# Patient Record
Sex: Female | Born: 1965 | Race: White | Hispanic: No | Marital: Married | Smoking: Never smoker
Health system: Southern US, Community
[De-identification: ages and names within clinical notes are randomized; demographics above are authoritative.]

## PROBLEM LIST (undated history)

## (undated) DIAGNOSIS — J302 Other seasonal allergic rhinitis: Secondary | ICD-10-CM

## (undated) DIAGNOSIS — K449 Diaphragmatic hernia without obstruction or gangrene: Secondary | ICD-10-CM

## (undated) DIAGNOSIS — K219 Gastro-esophageal reflux disease without esophagitis: Secondary | ICD-10-CM

## (undated) DIAGNOSIS — F419 Anxiety disorder, unspecified: Secondary | ICD-10-CM

## (undated) HISTORY — PX: CHOLECYSTECTOMY: SHX55

## (undated) HISTORY — PX: NASAL SEPTUM SURGERY: SHX37

## (undated) HISTORY — PX: LAPAROSCOPY: SHX197

## (undated) HISTORY — DX: Diaphragmatic hernia without obstruction or gangrene: K44.9

---

## 2007-03-21 ENCOUNTER — Ambulatory Visit: Payer: Self-pay | Admitting: Cardiology

## 2014-02-28 ENCOUNTER — Other Ambulatory Visit: Payer: Self-pay | Admitting: Family Medicine

## 2014-02-28 DIAGNOSIS — M25512 Pain in left shoulder: Secondary | ICD-10-CM

## 2014-03-03 ENCOUNTER — Other Ambulatory Visit: Payer: Self-pay

## 2014-03-06 ENCOUNTER — Ambulatory Visit
Admission: RE | Admit: 2014-03-06 | Discharge: 2014-03-06 | Disposition: A | Discharge status: Home / Self Care | Payer: 59 | Source: Ambulatory Visit | Attending: Family Medicine | Admitting: Family Medicine

## 2014-03-06 DIAGNOSIS — M25512 Pain in left shoulder: Secondary | ICD-10-CM

## 2015-07-05 ENCOUNTER — Encounter (INDEPENDENT_AMBULATORY_CARE_PROVIDER_SITE_OTHER): Payer: Self-pay | Admitting: *Deleted

## 2015-09-13 ENCOUNTER — Encounter (INDEPENDENT_AMBULATORY_CARE_PROVIDER_SITE_OTHER): Payer: Self-pay | Admitting: *Deleted

## 2015-11-26 ENCOUNTER — Other Ambulatory Visit (INDEPENDENT_AMBULATORY_CARE_PROVIDER_SITE_OTHER): Payer: Self-pay | Admitting: *Deleted

## 2015-11-26 DIAGNOSIS — Z1211 Encounter for screening for malignant neoplasm of colon: Secondary | ICD-10-CM

## 2015-12-19 ENCOUNTER — Encounter (INDEPENDENT_AMBULATORY_CARE_PROVIDER_SITE_OTHER): Payer: Self-pay | Admitting: *Deleted

## 2015-12-19 ENCOUNTER — Other Ambulatory Visit (INDEPENDENT_AMBULATORY_CARE_PROVIDER_SITE_OTHER): Payer: Self-pay | Admitting: *Deleted

## 2015-12-19 MED ORDER — PEG 3350-KCL-NA BICARB-NACL 420 G PO SOLR: 4000.0000 mL | Freq: Once | ORAL | Status: DC

## 2015-12-19 NOTE — Telephone Encounter (Signed)
Patient needs trilyte 

## 2015-12-27 ENCOUNTER — Telehealth (INDEPENDENT_AMBULATORY_CARE_PROVIDER_SITE_OTHER): Payer: Self-pay | Admitting: *Deleted

## 2015-12-27 NOTE — Telephone Encounter (Signed)
Referring MD/PCP: daniel   Procedure: tcs  Reason/Indication:  screening  Has patient had this procedure before?  no  If so, when, by whom and where?    Is there a family history of colon cancer?  no  Who?  What age when diagnosed?    Is patient diabetic?   no      Does patient have prosthetic heart valve or mechanical valve?  no  Do you have a pacemaker?  no  Has patient ever had endocarditis? no  Has patient had joint replacement within last 12 months?  no  Does patient tend to be constipated or take laxatives? yes  Does patient have a history of alcohol/drug use?  no  Is patient on Coumadin, Plavix and/or Aspirin? no  Medications: lexapro 10 mg 1/2 tab daily, nexium 20 mg bid, prevacid daily, claritin 10 mg daily, miralax prn, vit bm multi vit, calcium, tylenol prn  Allergies: nkda  Medication Adjustment:   Procedure date & time: 01/23/16 at 830

## 2015-12-31 NOTE — Telephone Encounter (Signed)
agree

## 2016-01-23 ENCOUNTER — Ambulatory Visit (HOSPITAL_COMMUNITY)
Admission: RE | Admit: 2016-01-23 | Discharge: 2016-01-23 | Disposition: A | Discharge status: Home / Self Care | Payer: 59 | Source: Ambulatory Visit | Attending: Internal Medicine | Admitting: Internal Medicine

## 2016-01-23 ENCOUNTER — Encounter (HOSPITAL_COMMUNITY)
Admission: RE | Disposition: A | Discharge status: Home / Self Care | Payer: Self-pay | Source: Ambulatory Visit | Attending: Internal Medicine

## 2016-01-23 ENCOUNTER — Encounter (HOSPITAL_COMMUNITY): Payer: Self-pay | Admitting: *Deleted

## 2016-01-23 DIAGNOSIS — D120 Benign neoplasm of cecum: Secondary | ICD-10-CM | POA: Diagnosis not present

## 2016-01-23 DIAGNOSIS — Z79899 Other long term (current) drug therapy: Secondary | ICD-10-CM | POA: Insufficient documentation

## 2016-01-23 DIAGNOSIS — Z1211 Encounter for screening for malignant neoplasm of colon: Secondary | ICD-10-CM | POA: Diagnosis not present

## 2016-01-23 DIAGNOSIS — F419 Anxiety disorder, unspecified: Secondary | ICD-10-CM | POA: Diagnosis not present

## 2016-01-23 DIAGNOSIS — K644 Residual hemorrhoidal skin tags: Secondary | ICD-10-CM | POA: Insufficient documentation

## 2016-01-23 DIAGNOSIS — K219 Gastro-esophageal reflux disease without esophagitis: Secondary | ICD-10-CM | POA: Diagnosis not present

## 2016-01-23 HISTORY — DX: Other seasonal allergic rhinitis: J30.2

## 2016-01-23 HISTORY — DX: Gastro-esophageal reflux disease without esophagitis: K21.9

## 2016-01-23 HISTORY — DX: Anxiety disorder, unspecified: F41.9

## 2016-01-23 HISTORY — PX: COLONOSCOPY: SHX5424

## 2016-01-23 SURGERY — COLONOSCOPY
Anesthesia: Moderate Sedation

## 2016-01-23 MED ORDER — MEPERIDINE HCL 50 MG/ML IJ SOLN
INTRAMUSCULAR | Status: AC
  Filled 2016-01-23: qty 1

## 2016-01-23 MED ORDER — STERILE WATER FOR IRRIGATION IR SOLN
Status: DC | PRN
  Administered 2016-01-23: 08:00:00

## 2016-01-23 MED ORDER — SODIUM CHLORIDE 0.9 % IV SOLN
INTRAVENOUS | Status: DC
  Administered 2016-01-23: 08:00:00 via INTRAVENOUS

## 2016-01-23 MED ORDER — MEPERIDINE HCL 50 MG/ML IJ SOLN
INTRAMUSCULAR | Status: DC | PRN
  Administered 2016-01-23 (×2): 25 mg via INTRAVENOUS

## 2016-01-23 MED ORDER — MIDAZOLAM HCL 5 MG/5ML IJ SOLN
INTRAMUSCULAR | Status: DC | PRN
  Administered 2016-01-23 (×3): 2 mg via INTRAVENOUS

## 2016-01-23 MED ORDER — MIDAZOLAM HCL 5 MG/5ML IJ SOLN
INTRAMUSCULAR | Status: DC
Start: 2016-01-23 — End: 2016-01-23
  Filled 2016-01-23: qty 10

## 2016-01-23 NOTE — H&P (Signed)
Toni Wolf is an 50 y.o. female.   Chief Complaint: Patient is here for colonoscopy. HPI: Patient is 50 year old Caucasian female who is here for screening colonoscopy. She denies abdominal pain change in bowel habits or rectal bleeding. Family history is negative for CRC.  Past Medical History  Diagnosis Date  . Seasonal allergies   . Anxiety   . GERD (gastroesophageal reflux disease)     Past Surgical History  Procedure Laterality Date  . Cholecystectomy    . Nasal septum surgery    . Laparoscopy      History reviewed. No pertinent family history. Social History:  reports that she has never smoked. She does not have any smokeless tobacco history on file. She reports that she drinks alcohol. She reports that she does not use illicit drugs.  Allergies: No Known Allergies  Medications Prior to Admission  Medication Sig Dispense Refill  . calcium citrate-vitamin D (CITRACAL+D) 315-200 MG-UNIT tablet Take 1 tablet by mouth daily.    . Cyanocobalamin (VITAMIN B-12 PO) Take 1 tablet by mouth daily.    Marland Kitchen escitalopram (LEXAPRO) 10 MG tablet Take 5 mg by mouth daily.  0  . esomeprazole (NEXIUM) 40 MG capsule Take 40 mg by mouth daily at 12 noon.    Marland Kitchen FLORA-Q (FLORA-Q) CAPS capsule Take 1 capsule by mouth daily.    Marland Kitchen loratadine (CLARITIN) 10 MG tablet Take 10 mg by mouth daily.    . polyethylene glycol-electrolytes (NULYTELY/GOLYTELY) 420 g solution Take 4,000 mLs by mouth once. 4000 mL 0  . ranitidine (ZANTAC) 150 MG tablet Take 150 mg by mouth 2 (two) times daily.      No results found for this or any previous visit (from the past 48 hour(s)). No results found.  ROS  Blood pressure 112/71, pulse 65, temperature 98.2 F (36.8 C), temperature source Oral, resp. rate 13, height 5\' 4"  (1.626 m), weight 161 lb (73.029 kg), SpO2 100 %. Physical Exam  Constitutional: She appears well-developed and well-nourished.  HENT:  Mouth/Throat: Oropharynx is clear and moist.  Eyes:  Conjunctivae are normal. No scleral icterus.  Neck: No thyromegaly present.  Cardiovascular: Normal rate, regular rhythm and normal heart sounds.   No murmur heard. Respiratory: Effort normal and breath sounds normal.  GI: Soft. She exhibits no distension and no mass. There is no tenderness.  Musculoskeletal: She exhibits no edema.  Lymphadenopathy:    She has no cervical adenopathy.  Neurological: She is alert.  Skin: Skin is warm and dry.     Assessment/Plan Average risk screening colonoscopy.  Rogene Houston, MD 01/23/2016, 8:20 AM

## 2016-01-23 NOTE — Discharge Instructions (Signed)
No aspirin or NSAIDs for 3 days. Resume usual medications and diet. No driving for 24 hours. Physician will call with biopsy results.   Colon Polyps Polyps are lumps of extra tissue growing inside the body. Polyps can grow in the large intestine (colon). Most colon polyps are noncancerous (benign). However, some colon polyps can become cancerous over time. Polyps that are larger than a pea may be harmful. To be safe, caregivers remove and test all polyps. CAUSES  Polyps form when mutations in the genes cause your cells to grow and divide even though no more tissue is needed. RISK FACTORS There are a number of risk factors that can increase your chances of getting colon polyps. They include:  Being older than 50 years.  Family history of colon polyps or colon cancer.  Long-term colon diseases, such as colitis or Crohn disease.  Being overweight.  Smoking.  Being inactive.  Drinking too much alcohol. SYMPTOMS  Most small polyps do not cause symptoms. If symptoms are present, they may include:  Blood in the stool. The stool may look dark red or black.  Constipation or diarrhea that lasts longer than 1 week. DIAGNOSIS People often do not know they have polyps until their caregiver finds them during a regular checkup. Your caregiver can use 4 tests to check for polyps:  Digital rectal exam. The caregiver wears gloves and feels inside the rectum. This test would find polyps only in the rectum.  Barium enema. The caregiver puts a liquid called barium into your rectum before taking X-rays of your colon. Barium makes your colon look white. Polyps are dark, so they are easy to see in the X-ray pictures.  Sigmoidoscopy. A thin, flexible tube (sigmoidoscope) is placed into your rectum. The sigmoidoscope has a light and tiny camera in it. The caregiver uses the sigmoidoscope to look at the last third of your colon.  Colonoscopy. This test is like sigmoidoscopy, but the caregiver looks at  the entire colon. This is the most common method for finding and removing polyps. TREATMENT  Any polyps will be removed during a sigmoidoscopy or colonoscopy. The polyps are then tested for cancer. PREVENTION  To help lower your risk of getting more colon polyps:  Eat plenty of fruits and vegetables. Avoid eating fatty foods.  Do not smoke.  Avoid drinking alcohol.  Exercise every day.  Lose weight if recommended by your caregiver.  Eat plenty of calcium and folate. Foods that are rich in calcium include milk, cheese, and broccoli. Foods that are rich in folate include chickpeas, kidney beans, and spinach. HOME CARE INSTRUCTIONS Keep all follow-up appointments as directed by your caregiver. You may need periodic exams to check for polyps. SEEK MEDICAL CARE IF: You notice bleeding during a bowel movement.   This information is not intended to replace advice given to you by your health care provider. Make sure you discuss any questions you have with your health care provider.   Document Released: 07/22/2004 Document Revised: 11/16/2014 Document Reviewed: 01/05/2012 Elsevier Interactive Patient Education 2016 Elsevier [employer] Colonoscopy, Care After Refer to this sheet in the next few weeks. These instructions provide you with information on caring for yourself after your procedure. Your health care provider may also give you more specific instructions. Your treatment has been planned according to current medical practices, but problems sometimes occur. Call your health care provider if you have any problems or questions after your procedure. WHAT TO EXPECT AFTER THE PROCEDURE  After your procedure, it is  typical to have the following:  A small amount of blood in your stool.  Moderate amounts of gas and mild abdominal cramping or bloating. HOME CARE INSTRUCTIONS  Do not drive, operate machinery, or sign important documents for 24 hours.  You may shower and resume your regular  physical activities, but move at a slower pace for the first 24 hours.  Take frequent rest periods for the first 24 hours.  Walk around or put a warm pack on your abdomen to help reduce abdominal cramping and bloating.  Drink enough fluids to keep your urine clear or pale yellow.  You may resume your normal diet as instructed by your health care provider. Avoid heavy or fried foods that are hard to digest.  Avoid drinking alcohol for 24 hours or as instructed by your health care provider.  Only take over-the-counter or prescription medicines as directed by your health care provider.  If a tissue sample (biopsy) was taken during your procedure:  Do not take aspirin or blood thinners for 7 days, or as instructed by your health care provider.  Do not drink alcohol for 7 days, or as instructed by your health care provider.  Eat soft foods for the first 24 hours. SEEK MEDICAL CARE IF: You have persistent spotting of blood in your stool 2-3 days after the procedure. SEEK IMMEDIATE MEDICAL CARE IF:  You have more than a small spotting of blood in your stool.  You pass large blood clots in your stool.  Your abdomen is swollen (distended).  You have nausea or vomiting.  You have a fever.  You have increasing abdominal pain that is not relieved with medicine.   This information is not intended to replace advice given to you by your health care provider. Make sure you discuss any questions you have with your health care provider.   Document Released: 06/09/2004 Document Revised: 08/16/2013 Document Reviewed: 07/03/2013 Elsevier Interactive Patient Education [employer].

## 2016-01-23 NOTE — Op Note (Signed)
[facility] Patient Name: Toni Wolf Procedure Date: 01/23/2016 8:13 AM MRN: [id number] Date of Birth: 07-14-1966 Attending MD: Hildred Laser , MD CSN: [id number] Age: 50 Admit Type: Outpatient Procedure:                Colonoscopy Indications:              Screening for colorectal malignant neoplasm Providers:                Hildred Laser, MD, Rosina Lowenstein, RN, Isabella Stalling, Technician Referring MD:             Gar Ponto, MD Medicines:                Meperidine 50 mg IV, Midazolam 6 mg IV Complications:            No immediate complications. Estimated Blood Loss:     Estimated blood loss was minimal. Procedure:                Pre-Anesthesia Assessment:                           - Prior to the procedure, a History and Physical                            was performed, and patient medications and                            allergies were reviewed. The patient's tolerance of                            previous anesthesia was also reviewed. The risks                            and benefits of the procedure and the sedation                            options and risks were discussed with the patient.                            All questions were answered, and informed consent                            was obtained. Prior Anticoagulants: The patient has                            taken no previous anticoagulant or antiplatelet                            agents. ASA Grade Assessment: I - A normal, healthy                            patient. After reviewing the risks and benefits,  the patient was deemed in satisfactory condition to                            undergo the procedure.                           After obtaining informed consent, the colonoscope                            was passed under direct vision. Throughout the                            procedure, the patient's blood pressure, pulse, and            oxygen saturations were monitored continuously. The                            [id number]) scope was introduced through                            the anus and advanced to the the cecum, identified                            by appendiceal orifice and ileocecal valve. The                            colonoscopy was performed without difficulty. The                            patient tolerated the procedure well. The quality                            of the bowel preparation was good. The ileocecal                            valve, appendiceal orifice, and rectum were                            photographed. Scope In: 8:31:20 AM Scope Out: 8:49:12 AM Total Procedure Duration: 0 hours 17 minutes 52 seconds  Findings:      A 5 mm polyp was found in the cecum. The polyp was semi-sessile. The       polyp was removed with a lift and cut technique using a hot snare.       Resection was complete, and retrieval was complete. Estimated blood loss       was minimal.      Non-bleeding external hemorrhoids were found during retroflexion. The       hemorrhoids were small.      The exam was otherwise without abnormality. Impression:               - One 5 mm polyp in the cecum, removed using lift                            and cut and a hot snare. Resected and retrieved.                           -  Non-bleeding external hemorrhoids.                           - The examination was otherwise normal. Moderate Sedation:      Moderate (conscious) sedation was administered by the endoscopy nurse       and supervised by the endoscopist. The following parameters were       monitored: oxygen saturation, heart rate, blood pressure, CO2       capnography and response to care. Total physician intraservice time was       26 minutes. Recommendation:           - Resume previous diet today.                           - Continue present medications.                           - No aspirin, ibuprofen,  naproxen, or other                            non-steroidal anti-inflammatory drugs for 3 days                            after polyp removal.                           - Await pathology results.                           - Repeat colonoscopy date to be determined after                            pending pathology results are reviewed for                            surveillance based on pathology results.                           - Patient has a contact number available for                            emergencies. The signs and symptoms of potential                            delayed complications were discussed with the                            patient. Return to normal activities tomorrow.                            Written discharge instructions were provided to the                            patient. Procedure Code(s):        --- Professional ---  [id number], Colonoscopy, flexible; with removal of                            tumor(s), polyp(s), or other lesion(s) by snare                            technique                           99152, Moderate sedation services provided by the                            same physician or other qualified health care                            professional performing the diagnostic or                            therapeutic service that the sedation supports,                            requiring the presence of an independent trained                            observer to assist in the monitoring of the                            patient's level of consciousness and physiological                            status; initial 15 minutes of intraservice time,                            patient age 81 years or older                           [phone], Moderate sedation services; each additional                            15 minutes intraservice time Diagnosis Code(s):        --- Professional ---                           Z12.11,  Encounter for screening for malignant                            neoplasm of colon                           D12.0, Benign neoplasm of cecum                           K64.4, Residual hemorrhoidal skin tags CPT copyright 2016 American Medical Association. All rights reserved. The codes documented in this report are preliminary and upon coder review may  be revised to meet current compliance requirements. [employer]  Laural Golden, MD Hildred Laser, MD 01/23/2016 8:58:35 AM This report has been signed electronically. Number of Addenda: 0

## 2016-01-28 ENCOUNTER — Encounter (HOSPITAL_COMMUNITY): Payer: Self-pay | Admitting: Internal Medicine

## 2016-11-16 DIAGNOSIS — J209 Acute bronchitis, unspecified: Secondary | ICD-10-CM | POA: Diagnosis not present

## 2016-11-16 DIAGNOSIS — J019 Acute sinusitis, unspecified: Secondary | ICD-10-CM | POA: Diagnosis not present

## 2016-12-21 DIAGNOSIS — Z0001 Encounter for general adult medical examination with abnormal findings: Secondary | ICD-10-CM | POA: Diagnosis not present

## 2016-12-24 DIAGNOSIS — Z0001 Encounter for general adult medical examination with abnormal findings: Secondary | ICD-10-CM | POA: Diagnosis not present

## 2016-12-24 DIAGNOSIS — Z23 Encounter for immunization: Secondary | ICD-10-CM | POA: Diagnosis not present

## 2017-02-22 DIAGNOSIS — Z1231 Encounter for screening mammogram for malignant neoplasm of breast: Secondary | ICD-10-CM | POA: Diagnosis not present

## 2017-03-05 DIAGNOSIS — Z01419 Encounter for gynecological examination (general) (routine) without abnormal findings: Secondary | ICD-10-CM | POA: Diagnosis not present

## 2017-06-11 DIAGNOSIS — R300 Dysuria: Secondary | ICD-10-CM | POA: Diagnosis not present

## 2017-07-02 DIAGNOSIS — R300 Dysuria: Secondary | ICD-10-CM | POA: Diagnosis not present

## 2017-07-31 DIAGNOSIS — B079 Viral wart, unspecified: Secondary | ICD-10-CM | POA: Diagnosis not present

## 2017-07-31 DIAGNOSIS — J0101 Acute recurrent maxillary sinusitis: Secondary | ICD-10-CM | POA: Diagnosis not present

## 2017-08-11 DIAGNOSIS — R51 Headache: Secondary | ICD-10-CM | POA: Diagnosis not present

## 2017-08-11 DIAGNOSIS — R5383 Other fatigue: Secondary | ICD-10-CM | POA: Diagnosis not present

## 2017-08-11 DIAGNOSIS — M791 Myalgia, unspecified site: Secondary | ICD-10-CM | POA: Diagnosis not present

## 2017-10-13 DIAGNOSIS — Z23 Encounter for immunization: Secondary | ICD-10-CM | POA: Diagnosis not present

## 2017-10-19 DIAGNOSIS — J209 Acute bronchitis, unspecified: Secondary | ICD-10-CM | POA: Diagnosis not present

## 2017-10-22 DIAGNOSIS — J209 Acute bronchitis, unspecified: Secondary | ICD-10-CM | POA: Diagnosis not present

## 2017-11-25 DIAGNOSIS — M25512 Pain in left shoulder: Secondary | ICD-10-CM | POA: Diagnosis not present

## 2017-11-27 DIAGNOSIS — M25512 Pain in left shoulder: Secondary | ICD-10-CM | POA: Diagnosis not present

## 2017-11-29 DIAGNOSIS — M5032 Other cervical disc degeneration, mid-cervical region, unspecified level: Secondary | ICD-10-CM | POA: Diagnosis not present

## 2017-11-29 DIAGNOSIS — M546 Pain in thoracic spine: Secondary | ICD-10-CM | POA: Diagnosis not present

## 2017-11-29 DIAGNOSIS — M5412 Radiculopathy, cervical region: Secondary | ICD-10-CM | POA: Diagnosis not present

## 2017-11-29 DIAGNOSIS — M542 Cervicalgia: Secondary | ICD-10-CM | POA: Diagnosis not present

## 2017-12-03 DIAGNOSIS — M542 Cervicalgia: Secondary | ICD-10-CM | POA: Diagnosis not present

## 2017-12-08 DIAGNOSIS — M5412 Radiculopathy, cervical region: Secondary | ICD-10-CM | POA: Diagnosis not present

## 2017-12-08 DIAGNOSIS — M5032 Other cervical disc degeneration, mid-cervical region, unspecified level: Secondary | ICD-10-CM | POA: Diagnosis not present

## 2017-12-08 DIAGNOSIS — M542 Cervicalgia: Secondary | ICD-10-CM | POA: Diagnosis not present

## 2017-12-17 DIAGNOSIS — M5032 Other cervical disc degeneration, mid-cervical region, unspecified level: Secondary | ICD-10-CM | POA: Diagnosis not present

## 2017-12-17 DIAGNOSIS — M5412 Radiculopathy, cervical region: Secondary | ICD-10-CM | POA: Diagnosis not present

## 2017-12-17 DIAGNOSIS — M4802 Spinal stenosis, cervical region: Secondary | ICD-10-CM | POA: Diagnosis not present

## 2017-12-24 DIAGNOSIS — Z0001 Encounter for general adult medical examination with abnormal findings: Secondary | ICD-10-CM | POA: Diagnosis not present

## 2017-12-27 DIAGNOSIS — Z0001 Encounter for general adult medical examination with abnormal findings: Secondary | ICD-10-CM | POA: Diagnosis not present

## 2018-01-04 DIAGNOSIS — M542 Cervicalgia: Secondary | ICD-10-CM | POA: Diagnosis not present

## 2018-01-04 DIAGNOSIS — M4802 Spinal stenosis, cervical region: Secondary | ICD-10-CM | POA: Diagnosis not present

## 2018-01-04 DIAGNOSIS — M5412 Radiculopathy, cervical region: Secondary | ICD-10-CM | POA: Diagnosis not present

## 2018-01-28 DIAGNOSIS — M542 Cervicalgia: Secondary | ICD-10-CM | POA: Diagnosis not present

## 2018-01-28 DIAGNOSIS — M5412 Radiculopathy, cervical region: Secondary | ICD-10-CM | POA: Diagnosis not present

## 2018-01-28 DIAGNOSIS — M4802 Spinal stenosis, cervical region: Secondary | ICD-10-CM | POA: Diagnosis not present

## 2018-02-12 DIAGNOSIS — R21 Rash and other nonspecific skin eruption: Secondary | ICD-10-CM | POA: Diagnosis not present

## 2018-02-15 DIAGNOSIS — H04123 Dry eye syndrome of bilateral lacrimal glands: Secondary | ICD-10-CM | POA: Diagnosis not present

## 2018-02-24 DIAGNOSIS — Z1231 Encounter for screening mammogram for malignant neoplasm of breast: Secondary | ICD-10-CM | POA: Diagnosis not present

## 2018-03-08 DIAGNOSIS — Z01419 Encounter for gynecological examination (general) (routine) without abnormal findings: Secondary | ICD-10-CM | POA: Diagnosis not present

## 2018-06-17 DIAGNOSIS — M545 Low back pain: Secondary | ICD-10-CM | POA: Diagnosis not present

## 2018-06-28 DIAGNOSIS — J301 Allergic rhinitis due to pollen: Secondary | ICD-10-CM | POA: Diagnosis not present

## 2018-06-28 DIAGNOSIS — K219 Gastro-esophageal reflux disease without esophagitis: Secondary | ICD-10-CM | POA: Diagnosis not present

## 2018-06-28 DIAGNOSIS — Z1389 Encounter for screening for other disorder: Secondary | ICD-10-CM | POA: Diagnosis not present

## 2018-07-13 DIAGNOSIS — G43001 Migraine without aura, not intractable, with status migrainosus: Secondary | ICD-10-CM | POA: Diagnosis not present

## 2018-07-13 DIAGNOSIS — Z6824 Body mass index (BMI) 24.0-24.9, adult: Secondary | ICD-10-CM | POA: Diagnosis not present

## 2018-09-27 DIAGNOSIS — Z23 Encounter for immunization: Secondary | ICD-10-CM | POA: Diagnosis not present

## 2018-10-14 DIAGNOSIS — Z6825 Body mass index (BMI) 25.0-25.9, adult: Secondary | ICD-10-CM | POA: Diagnosis not present

## 2018-10-14 DIAGNOSIS — J019 Acute sinusitis, unspecified: Secondary | ICD-10-CM | POA: Diagnosis not present

## 2019-01-03 DIAGNOSIS — Z0001 Encounter for general adult medical examination with abnormal findings: Secondary | ICD-10-CM | POA: Diagnosis not present

## 2019-01-10 DIAGNOSIS — Z0001 Encounter for general adult medical examination with abnormal findings: Secondary | ICD-10-CM | POA: Diagnosis not present

## 2019-01-26 DIAGNOSIS — E538 Deficiency of other specified B group vitamins: Secondary | ICD-10-CM | POA: Diagnosis not present

## 2019-01-26 DIAGNOSIS — R42 Dizziness and giddiness: Secondary | ICD-10-CM | POA: Diagnosis not present

## 2019-01-26 DIAGNOSIS — Z6824 Body mass index (BMI) 24.0-24.9, adult: Secondary | ICD-10-CM | POA: Diagnosis not present

## 2019-03-14 DIAGNOSIS — M47816 Spondylosis without myelopathy or radiculopathy, lumbar region: Secondary | ICD-10-CM | POA: Diagnosis not present

## 2019-03-14 DIAGNOSIS — M545 Low back pain: Secondary | ICD-10-CM | POA: Diagnosis not present

## 2019-03-24 DIAGNOSIS — M545 Low back pain: Secondary | ICD-10-CM | POA: Diagnosis not present

## 2019-03-27 DIAGNOSIS — Z1231 Encounter for screening mammogram for malignant neoplasm of breast: Secondary | ICD-10-CM | POA: Diagnosis not present

## 2020-02-15 ENCOUNTER — Other Ambulatory Visit: Payer: Self-pay | Admitting: Physician Assistant

## 2020-02-15 DIAGNOSIS — M25512 Pain in left shoulder: Secondary | ICD-10-CM

## 2020-02-16 ENCOUNTER — Ambulatory Visit
Admission: RE | Admit: 2020-02-16 | Discharge: 2020-02-16 | Disposition: A | Discharge status: Home / Self Care | Payer: 59 | Source: Ambulatory Visit | Attending: Physician Assistant | Admitting: Physician Assistant

## 2020-02-16 ENCOUNTER — Other Ambulatory Visit: Payer: Self-pay

## 2020-02-16 DIAGNOSIS — M25512 Pain in left shoulder: Secondary | ICD-10-CM

## 2021-01-02 ENCOUNTER — Encounter (INDEPENDENT_AMBULATORY_CARE_PROVIDER_SITE_OTHER): Payer: Self-pay | Admitting: *Deleted

## 2021-02-05 ENCOUNTER — Other Ambulatory Visit (INDEPENDENT_AMBULATORY_CARE_PROVIDER_SITE_OTHER): Payer: Self-pay | Admitting: *Deleted

## 2021-02-05 ENCOUNTER — Encounter (INDEPENDENT_AMBULATORY_CARE_PROVIDER_SITE_OTHER): Payer: Self-pay | Admitting: *Deleted

## 2021-02-05 ENCOUNTER — Telehealth (INDEPENDENT_AMBULATORY_CARE_PROVIDER_SITE_OTHER): Payer: Self-pay | Admitting: *Deleted

## 2021-02-05 NOTE — Telephone Encounter (Signed)
Patient needs sutab

## 2021-02-06 MED ORDER — SUTAB 1479-225-188 MG PO TABS: 1.0000 | ORAL_TABLET | Freq: Once | ORAL | 0 refills | Status: AC

## 2021-02-06 NOTE — Telephone Encounter (Signed)
Done

## 2021-02-07 ENCOUNTER — Other Ambulatory Visit (INDEPENDENT_AMBULATORY_CARE_PROVIDER_SITE_OTHER): Payer: Self-pay

## 2021-02-07 DIAGNOSIS — Z86010 Personal history of colonic polyps: Secondary | ICD-10-CM

## 2021-02-14 ENCOUNTER — Telehealth (INDEPENDENT_AMBULATORY_CARE_PROVIDER_SITE_OTHER): Payer: Self-pay | Admitting: *Deleted

## 2021-02-14 NOTE — Telephone Encounter (Signed)
Referring MD/PCP: daniel   Procedure: tcs  Reason/Indication:  Hx polyps  Has patient had this procedure before?  Yes, 2017  If so, when, by whom and where?    Is there a family history of colon cancer?  no  Who?  What age when diagnosed?    Is patient diabetic?   no      Does patient have prosthetic heart valve or mechanical valve?  no  Do you have a pacemaker/defibrillator?  no  Has patient ever had endocarditis/atrial fibrillation? no  Have you had a stroke/heart attack last 6 mths? no  Does patient use oxygen? no  Has patient had joint replacement within last 12 months?  no  Is patient constipated or do they take laxatives? no  Does patient have a history of alcohol/drug use?  no  Is patient on blood thinner such as Coumadin, Plavix and/or Aspirin? no  Do you take medicine for weight loss. no  Medications: escitalopram 10 mg 1/2 tab daily, nexium 20 mg bid, vit b12 1000 mcg daily, one a day vitamin daily, calcium daily  Allergies: nkda  Medication Adjustment per Dr Rehman/Dr Jenetta Downer   Procedure date & time: 03/13/21

## 2021-03-04 ENCOUNTER — Other Ambulatory Visit (HOSPITAL_COMMUNITY): Payer: 59

## 2021-03-11 ENCOUNTER — Other Ambulatory Visit (HOSPITAL_COMMUNITY)
Admission: RE | Admit: 2021-03-11 | Discharge: 2021-03-11 | Disposition: A | Discharge status: Home / Self Care | Payer: 59 | Source: Ambulatory Visit | Attending: Internal Medicine | Admitting: Internal Medicine

## 2021-03-11 ENCOUNTER — Other Ambulatory Visit: Payer: Self-pay

## 2021-03-11 DIAGNOSIS — Z01812 Encounter for preprocedural laboratory examination: Secondary | ICD-10-CM | POA: Insufficient documentation

## 2021-03-11 DIAGNOSIS — Z20822 Contact with and (suspected) exposure to covid-19: Secondary | ICD-10-CM | POA: Diagnosis not present

## 2021-03-11 LAB — SARS CORONAVIRUS 2 (TAT 6-24 HRS): SARS Coronavirus 2: NEGATIVE

## 2021-03-13 ENCOUNTER — Encounter (HOSPITAL_COMMUNITY)
Admission: RE | Disposition: A | Discharge status: Home / Self Care | Payer: Self-pay | Source: Ambulatory Visit | Attending: Internal Medicine

## 2021-03-13 ENCOUNTER — Ambulatory Visit (HOSPITAL_COMMUNITY)
Admission: RE | Admit: 2021-03-13 | Discharge: 2021-03-13 | Disposition: A | Discharge status: Home / Self Care | Payer: 59 | Source: Ambulatory Visit | Attending: Internal Medicine | Admitting: Internal Medicine

## 2021-03-13 ENCOUNTER — Encounter (HOSPITAL_COMMUNITY): Payer: Self-pay | Admitting: Internal Medicine

## 2021-03-13 ENCOUNTER — Other Ambulatory Visit: Payer: Self-pay

## 2021-03-13 DIAGNOSIS — D124 Benign neoplasm of descending colon: Secondary | ICD-10-CM | POA: Diagnosis not present

## 2021-03-13 DIAGNOSIS — D125 Benign neoplasm of sigmoid colon: Secondary | ICD-10-CM | POA: Insufficient documentation

## 2021-03-13 DIAGNOSIS — K6289 Other specified diseases of anus and rectum: Secondary | ICD-10-CM | POA: Insufficient documentation

## 2021-03-13 DIAGNOSIS — Z1211 Encounter for screening for malignant neoplasm of colon: Secondary | ICD-10-CM

## 2021-03-13 DIAGNOSIS — Z86010 Personal history of colonic polyps: Secondary | ICD-10-CM

## 2021-03-13 DIAGNOSIS — Z79899 Other long term (current) drug therapy: Secondary | ICD-10-CM | POA: Insufficient documentation

## 2021-03-13 DIAGNOSIS — K644 Residual hemorrhoidal skin tags: Secondary | ICD-10-CM

## 2021-03-13 HISTORY — PX: POLYPECTOMY: SHX5525

## 2021-03-13 HISTORY — PX: COLONOSCOPY: SHX5424

## 2021-03-13 HISTORY — PX: BIOPSY: SHX5522

## 2021-03-13 LAB — HM COLONOSCOPY

## 2021-03-13 SURGERY — COLONOSCOPY
Anesthesia: Moderate Sedation

## 2021-03-13 MED ORDER — MEPERIDINE HCL 50 MG/ML IJ SOLN
INTRAMUSCULAR | Status: DC | PRN
  Administered 2021-03-13 (×2): 25 mg

## 2021-03-13 MED ORDER — MEPERIDINE HCL 50 MG/ML IJ SOLN
INTRAMUSCULAR | Status: AC
  Filled 2021-03-13: qty 1

## 2021-03-13 MED ORDER — SODIUM CHLORIDE 0.9 % IV SOLN: INTRAVENOUS | Status: DC

## 2021-03-13 MED ORDER — MIDAZOLAM HCL 5 MG/5ML IJ SOLN
INTRAMUSCULAR | Status: DC | PRN
  Administered 2021-03-13: 2 mg via INTRAVENOUS
  Administered 2021-03-13: 1 mg via INTRAVENOUS
  Administered 2021-03-13: 2 mg via INTRAVENOUS

## 2021-03-13 MED ORDER — MIDAZOLAM HCL 5 MG/5ML IJ SOLN
INTRAMUSCULAR | Status: AC
  Filled 2021-03-13: qty 10

## 2021-03-13 NOTE — Discharge Instructions (Signed)
No aspirin or NSAIDs for 24 hours. Resume scheduled medications as before. Resume usual diet. No driving for 24 hours. Physician will call with biopsy results. Next colonoscopy in 5 years.   Colonoscopy, Adult, Care After This sheet gives you information about how to care for yourself after your procedure. Your health care provider may also give you more specific instructions. If you have problems or questions, contact your health care provider.  Dr Gala Romney:  [phone] What can I expect after the procedure? After the procedure, it is common to have:  A small amount of blood in your stool for 24 hours after the procedure.  Some gas.  Mild cramping or bloating of your abdomen. Follow these instructions at home: Eating and drinking  Drink enough fluid to keep your urine pale yellow.  Follow instructions from your health care provider about eating or drinking restrictions.  Resume your normal diet as instructed by your health care provider.   Activity  Rest as told by your health care provider.  Avoid sitting for a long time without moving. Get up to take short walks every 1-2 hours. This is important to improve blood flow and breathing. Ask for help if you feel weak or unsteady.  Return to your normal activities as told by your health care provider. Ask your health care provider what activities are safe for you. Managing cramping and bloating  Try walking around when you have cramps or feel bloated.    General instructions  If you were given a sedative during the procedure, it can affect you for several hours. Do not drive or operate machinery until your health care provider says that it is safe.  For the first 24 hours after the procedure: ? Do not sign important documents. ? Do not drink alcohol. ? Do your regular daily activities at a slower pace than normal.  Take over-the-counter and prescription medicines only as told by your health care provider.  Keep all follow-up  visits as told by your health care provider. This is important. Contact a health care provider if:  You have blood in your stool 2-3 days after the procedure. Get help right away if you have:  More than a small spotting of blood in your stool.  Large blood clots in your stool.  Swelling of your abdomen.  Nausea or vomiting.  A fever.  Increasing pain in your abdomen that is not relieved with medicine. Summary  After the procedure, it is common to have a small amount of blood in your stool. You may also have mild cramping and bloating of your abdomen.  If you were given a sedative during the procedure, it can affect you for several hours. Do not drive or operate machinery until your health care provider says that it is safe.  Get help right away if you have a lot of blood in your stool, nausea or vomiting, a fever, or increased pain in your abdomen. This information is not intended to replace advice given to you by your health care provider. Make sure you discuss any questions you have with your health care provider. Document Revised: 10/20/2019 Document Reviewed: 05/22/2019 Elsevier Patient Education  2021 [place] POST-ANESTHESIA  IMMEDIATELY FOLLOWING SURGERY:  Do not drive or operate machinery for the first twenty four hours after surgery.  Do not make any important decisions for twenty four hours after surgery or while taking narcotic pain medications or sedatives.  If you develop intractable nausea and vomiting or a  severe headache please notify your doctor immediately.  FOLLOW-UP:  Please make an appointment with your surgeon as instructed. You do not need to follow up with anesthesia unless specifically instructed to do so.  WOUND CARE INSTRUCTIONS (if applicable):  Keep a dry clean dressing on the anesthesia/puncture wound site if there is drainage.  Once the wound has quit draining you may leave it open to air.  Generally you should leave the  bandage intact for twenty four hours unless there is drainage.  If the epidural site drains for more than 36-48 hours please call the anesthesia department.  QUESTIONS?:  Please feel free to call your physician or the hospital operator if you have any questions, and they will be happy to assist you.

## 2021-03-13 NOTE — Op Note (Signed)
[facility] Patient Name: Toni Wolf Procedure Date: 03/13/2021 8:56 AM MRN: [id number] Date of Birth: 1966-05-01 Attending MD: Hildred Laser , MD CSN: [id number] Age: 55 Admit Type: Outpatient Procedure:                Colonoscopy Indications:              High risk colon cancer surveillance: Personal                            history of colonic polyps Providers:                Hildred Laser, MD, Janeece Riggers, RN, Raphael Gibney,                            Technician Referring MD:             Mitzie Na. [name], MD Medicines:                Meperidine 50 mg IV, Midazolam 5 mg IV Complications:            No immediate complications. Estimated Blood Loss:     Estimated blood loss was minimal. Procedure:                Pre-Anesthesia Assessment:                           - Prior to the procedure, a History and Physical                            was performed, and patient medications and                            allergies were reviewed. The patient's tolerance of                            previous anesthesia was also reviewed. The risks                            and benefits of the procedure and the sedation                            options and risks were discussed with the patient.                            All questions were answered, and informed consent                            was obtained. Prior Anticoagulants: The patient has                            taken no previous anticoagulant or antiplatelet                            agents. ASA Grade Assessment: II - A patient with  mild systemic disease. After reviewing the risks                            and benefits, the patient was deemed in                            satisfactory condition to undergo the procedure.                           After obtaining informed consent, the colonoscope                            was passed under direct vision. Throughout the                             procedure, the patient's blood pressure, pulse, and                            oxygen saturations were monitored continuously. The                            PCF-H190DL ([id number]) scope was introduced through                            the anus and advanced to the the cecum, identified                            by appendiceal orifice and ileocecal valve. The                            colonoscopy was performed without difficulty. The                            patient tolerated the procedure well. The quality                            of the bowel preparation was adequate. The                            ileocecal valve, appendiceal orifice, and rectum                            were photographed. Scope In: 9:22:04 AM Scope Out: 9:46:41 AM Scope Withdrawal Time: 0 hours 15 minutes 52 seconds  Total Procedure Duration: 0 hours 24 minutes 37 seconds  Findings:      The perianal and digital rectal examinations were normal.      A 3 mm polyp was found in the descending colon. The polyp was sessile.       Biopsies were taken with a cold forceps for histology. The pathology       specimen was placed into Bottle Number 1.      A 6 mm polyp was found in the sigmoid colon. The polyp was removed with       a cold snare. Resection and retrieval were complete. The  pathology       specimen was placed into Bottle Number 2.      The exam was otherwise normal throughout the examined colon.      External hemorrhoids were found during retroflexion. The hemorrhoids       were small.      Anal papilla(e) were hypertrophied. Impression:               - One 3 mm polyp in the descending colon. Biopsied.                           - One 6 mm polyp in the sigmoid colon, removed with                            a cold snare. Resected and retrieved.                           - External hemorrhoids.                           - Anal papilla(e) were hypertrophied. Moderate Sedation:      Moderate (conscious)  sedation was administered by the endoscopy nurse       and supervised by the endoscopist. The following parameters were       monitored: oxygen saturation, heart rate, blood pressure, CO2       capnography and response to care. Total physician intraservice time was       29 minutes. Recommendation:           - Patient has a contact number available for                            emergencies. The signs and symptoms of potential                            delayed complications were discussed with the                            patient. Return to normal activities tomorrow.                            Written discharge instructions were provided to the                            patient.                           - High fiber diet today.                           - Continue present medications.                           - No aspirin, ibuprofen, naproxen, or other                            non-steroidal anti-inflammatory drugs for 1 day.                           -  Await pathology results.                           - Repeat colonoscopy in 5 years for surveillance. Procedure Code(s):        --- Professional ---                           [id number], Colonoscopy, flexible; with removal of                            tumor(s), polyp(s), or other lesion(s) by snare                            technique                           45380, 59, Colonoscopy, flexible; with biopsy,                            single or multiple                           99153, Moderate sedation; each additional 15                            minutes intraservice time                           G0500, Moderate sedation services provided by the                            same physician or other qualified health care                            professional performing a gastrointestinal                            endoscopic service that sedation supports,                            requiring the presence of an independent trained                             observer to assist in the monitoring of the                            patient's level of consciousness and physiological                            status; initial 15 minutes of intra-service time;                            patient age 34 years or older (additional time may                            be reported with  99153, as appropriate) Diagnosis Code(s):        --- Professional ---                           Z86.010, Personal history of colonic polyps                           K63.5, Polyp of colon                           K62.89, Other specified diseases of anus and rectum                           K64.4, Residual hemorrhoidal skin tags CPT copyright 2019 American Medical Association. All rights reserved. The codes documented in this report are preliminary and upon coder review may  be revised to meet current compliance requirements. Hildred Laser, MD Hildred Laser, MD 03/13/2021 9:57:39 AM This report has been signed electronically. Number of Addenda: 0

## 2021-03-13 NOTE — H&P (Signed)
Toni Wolf is an 55 y.o. female.   Chief Complaint: Patient is here for colonoscopy. HPI: Patient is 55 year old Caucasian female who has history of colonic adenoma and is here for surveillance colonoscopy.  Last exam was in March 2017 with removal of 8 mm tubular adenoma.  She denies abdominal pain or rectal bleeding.  She is prone to constipation and takes polyethylene glycol.  She does not take aspirin or anticoagulants.  Family history is negative for colorectal carcinoma.  Past Medical History:  Diagnosis Date  . Anxiety   . GERD (gastroesophageal reflux disease)   . Seasonal allergies     Past Surgical History:  Procedure Laterality Date  . CHOLECYSTECTOMY    . COLONOSCOPY N/A 01/23/2016   Procedure: COLONOSCOPY;  Surgeon: Rogene Houston, MD;  Location: [facility];  Service: Endoscopy;  Laterality: N/A;  830  . LAPAROSCOPY    . NASAL SEPTUM SURGERY      Family History  Problem Relation Age of Onset  . Colon cancer Neg Hx    Social History:  reports that she has never smoked. She has never used smokeless tobacco. She reports current alcohol use. She reports that she does not use drugs.  Allergies: No Known Allergies  Medications Prior to Admission  Medication Sig Dispense Refill  . calcium citrate-vitamin D (CITRACAL+D) 315-200 MG-UNIT tablet Take 1 tablet by mouth daily.    . Cyanocobalamin (VITAMIN B-12 PO) Take 1 tablet by mouth daily.    Marland Kitchen escitalopram (LEXAPRO) 10 MG tablet Take 5 mg by mouth daily.  0  . esomeprazole (NEXIUM) 20 MG capsule Take 20 mg by mouth 2 (two) times daily before a meal.    . estradiol (VIVELLE-DOT) 0.0375 MG/24HR Place 1 patch onto the skin 2 (two) times a week.    Marland Kitchen FLORA-Q (FLORA-Q) CAPS capsule Take 1 capsule by mouth daily as needed (takes with antibiotics).    . fluticasone (FLONASE) 50 MCG/ACT nasal spray Place 1 spray into both nostrils daily as needed for allergies or rhinitis.    Marland Kitchen ibuprofen (ADVIL) 200 MG tablet Take 400 mg by  mouth every 8 (eight) hours as needed for moderate pain.    Marland Kitchen levocetirizine (XYZAL) 5 MG tablet Take 5 mg by mouth every evening.    . Multiple Vitamins-Minerals (MULTIVITAMIN WITH MINERALS) tablet Take 1 tablet by mouth daily.    Marland Kitchen Propylene Glycol (SYSTANE COMPLETE) 0.6 % SOLN Place 1 drop into both eyes in the morning and at bedtime.      No results found for this or any previous visit (from the past 48 hour(s)). No results found.  Review of Systems  Blood pressure 105/69, pulse 68, temperature 97.8 F (36.6 C), temperature source Oral, resp. rate 12, height 5\' 4"  (1.626 m), weight 67.1 kg, SpO2 96 %. Physical Exam HENT:     Mouth/Throat:     Mouth: Mucous membranes are moist.     Pharynx: Oropharynx is clear.  Eyes:     General: No scleral icterus.    Conjunctiva/sclera: Conjunctivae normal.  Cardiovascular:     Rate and Rhythm: Normal rate and regular rhythm.     Heart sounds: Normal heart sounds. No murmur heard.   Pulmonary:     Effort: Pulmonary effort is normal.     Breath sounds: Normal breath sounds.  Abdominal:     Palpations: Abdomen is soft. There is no mass.     Tenderness: There is no abdominal tenderness.  Musculoskeletal:  General: No swelling.     Cervical back: Neck supple.  Lymphadenopathy:     Cervical: No cervical adenopathy.  Skin:    General: Skin is warm and dry.  Neurological:     Mental Status: She is alert.      Assessment/Plan  History of colonic adenoma Surveillance colonoscopy  Hildred Laser, MD 03/13/2021, 9:09 AM

## 2021-03-14 LAB — SURGICAL PATHOLOGY

## 2021-03-17 ENCOUNTER — Encounter (INDEPENDENT_AMBULATORY_CARE_PROVIDER_SITE_OTHER): Payer: Self-pay | Admitting: *Deleted

## 2021-03-19 ENCOUNTER — Encounter (HOSPITAL_COMMUNITY): Payer: Self-pay | Admitting: Internal Medicine

## 2021-03-26 IMAGING — MR MR SHOULDER*L* W/O CM
4 of 5 series · 28 of 40 positions shown · non-contrast
Comparison: X-ray 02/05/2020, MRI 03/06/2014

CLINICAL DATA: Left shoulder pain after recent fall

EXAM:
MRI OF THE LEFT SHOULDER WITHOUT CONTRAST
TECHNIQUE: Multiplanar, multisequence MR imaging of the shoulder was performed.
No intravenous contrast was administered.

[Series 3: PD fat-sat · axial · 4.0mm · 0.27mm/px · z∈[-41,+33]mm · 8 of 17 slices shown (1 of 2)]
[im 1/17]
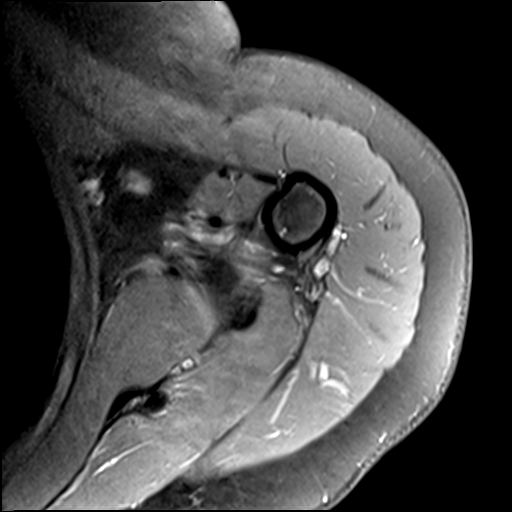
[im 3/17]
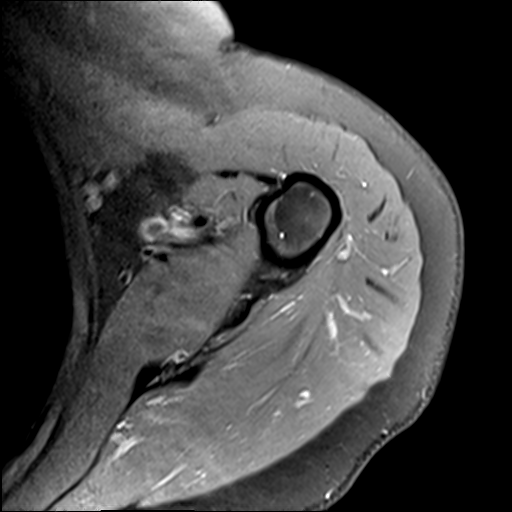
[im 5/17]
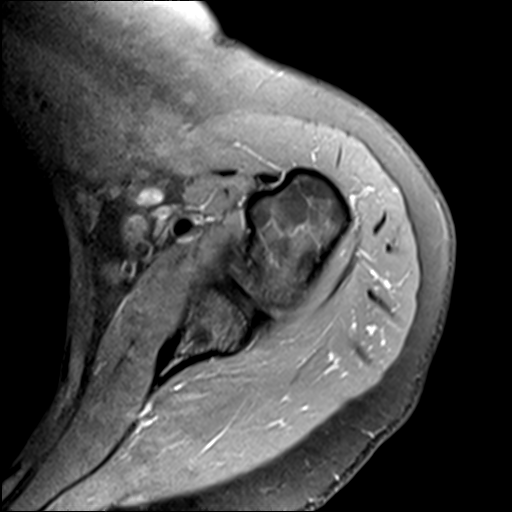
[im 7/17]
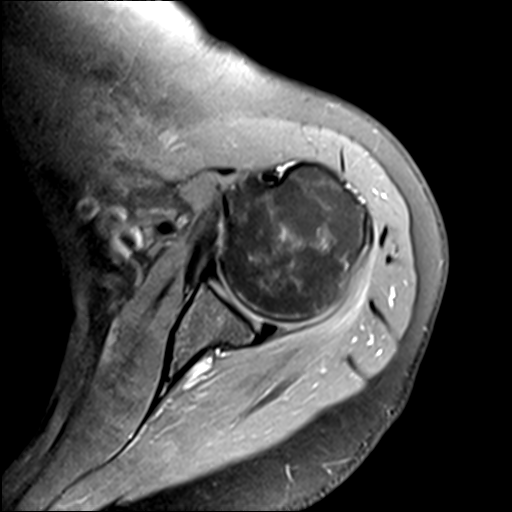
[im 10/17]
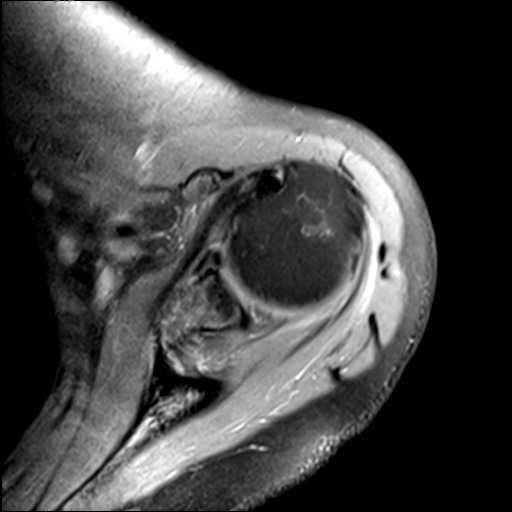
[im 12/17]
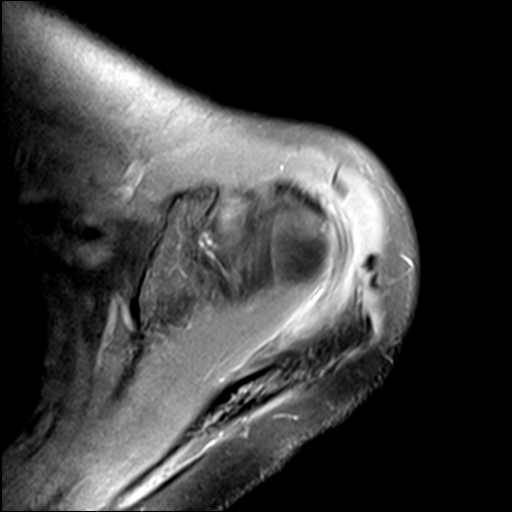
[im 14/17]
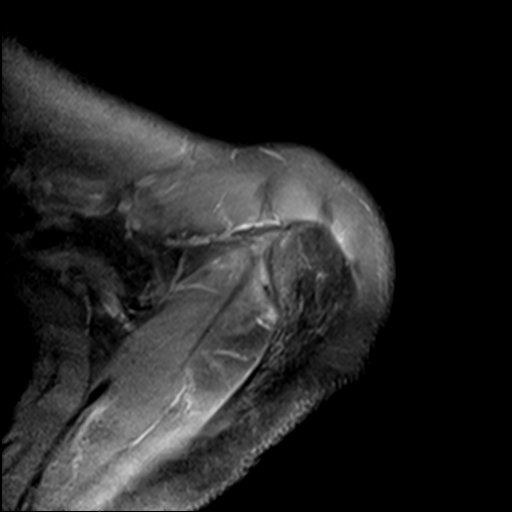
[im 17/17]
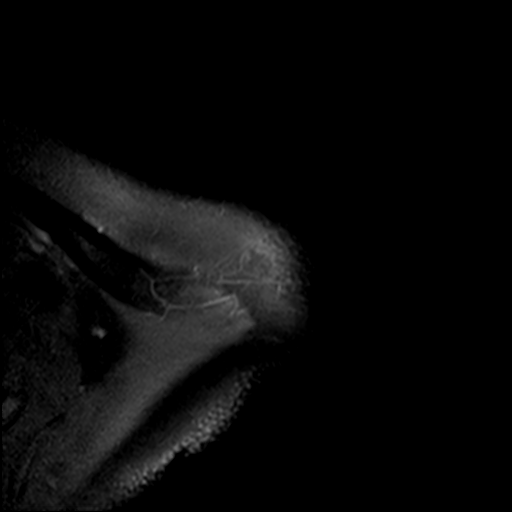

[Series 4: T2 fat-sat · oblique · 4.0mm · 0.55mm/px · 7 of 17 slices shown (1 of 2)]
[im 1/17]
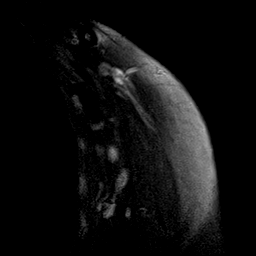
[im 3/17]
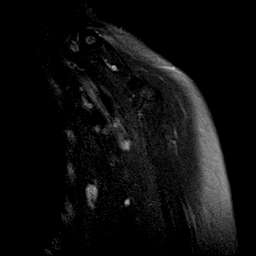
[im 6/17]
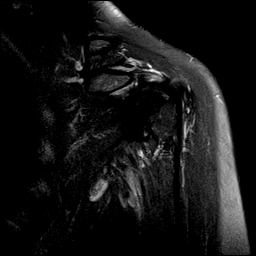
[im 9/17]
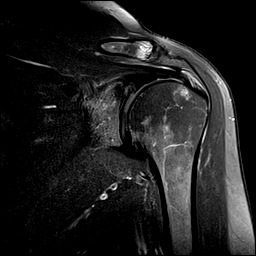
[im 11/17]
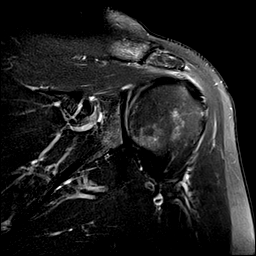
[im 14/17]
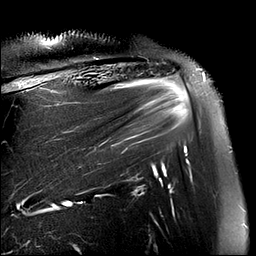
[im 17/17]
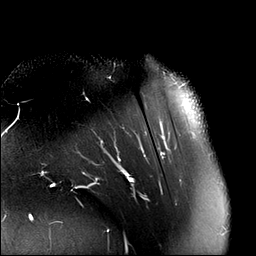

[Series 5: PD fat-sat · oblique · 4.0mm · 0.27mm/px · 7 of 17 slices shown (2 of 2)]
[im 1/17]
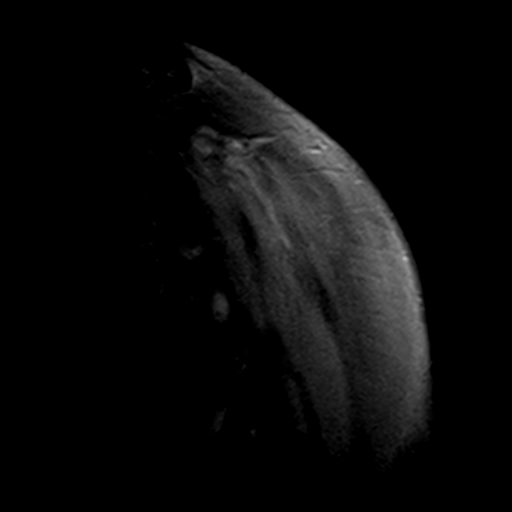
[im 3/17]
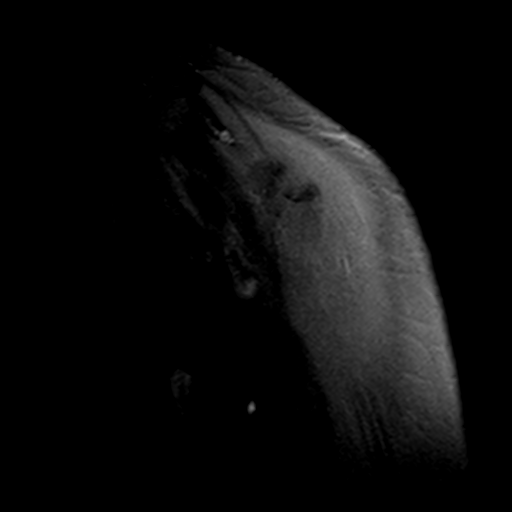
[im 6/17]
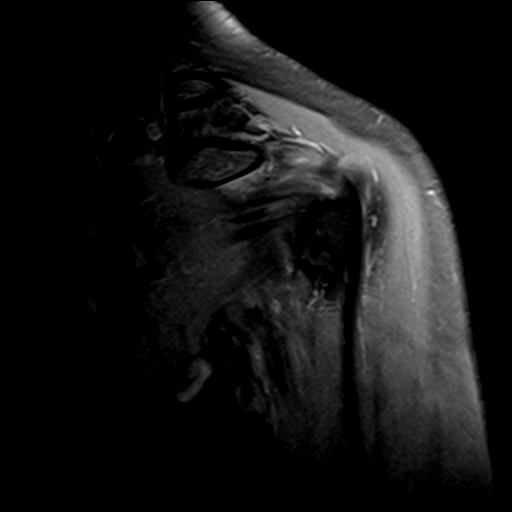
[im 9/17]
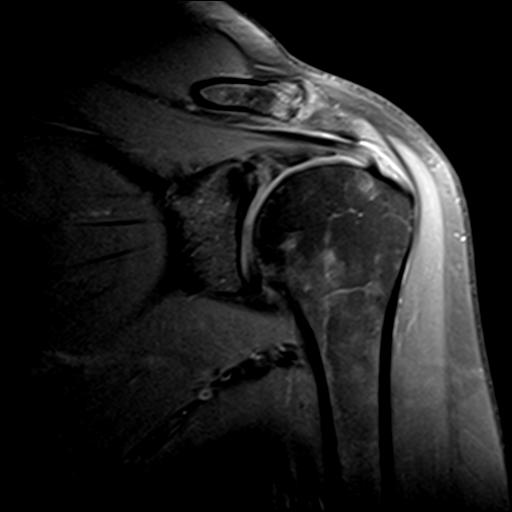
[im 11/17]
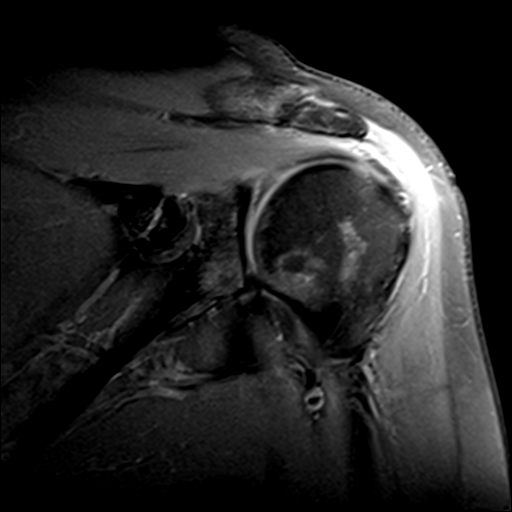
[im 14/17]
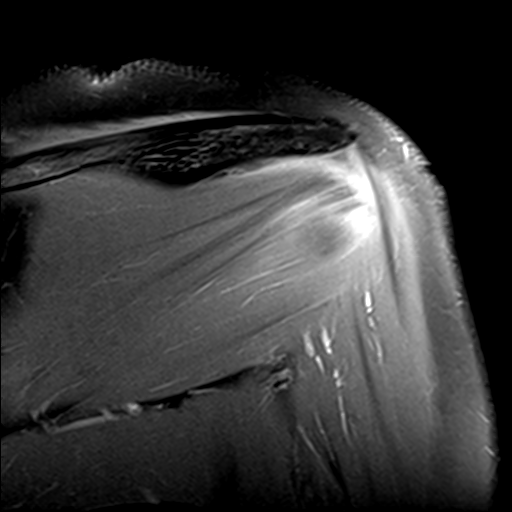
[im 17/17]
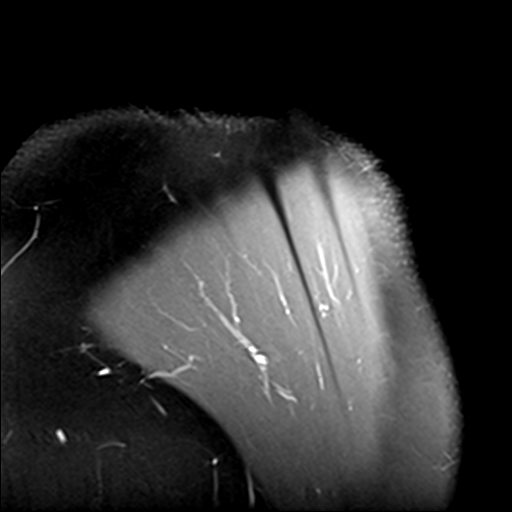

[Series 6: T2 fat-sat · oblique · 4.0mm · 0.55mm/px · 6 of 21 slices shown (2 of 2)]
[im 1/21]
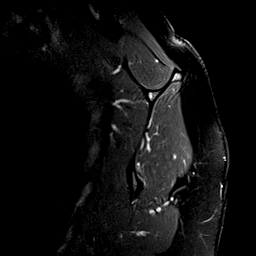
[im 3/21]
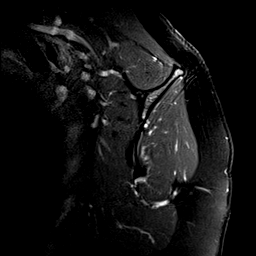
[im 6/21]
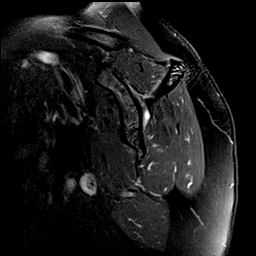
[im 8/21]
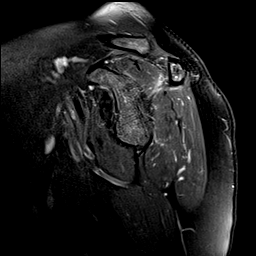
[im 11/21]
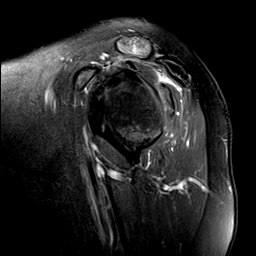
[im 18/21]
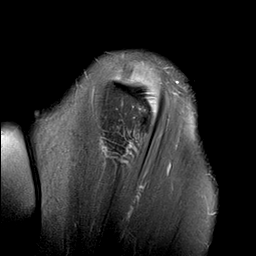

[28 of 40 positions shown; findings below may reference images not displayed]

FINDINGS: Rotator cuff: Supraspinatus tendinosis with low-grade bursal sided
fraying/irregularity. Interstitial tear of the far posterior
infraspinatus tendon fibers. Mild focal tendinosis of the inferior
aspect of the distal subscapularis tendon. Intact teres minor.

Muscles: Infraspinatus intramuscular edema. Normal muscle bulk
without atrophy or fatty infiltration.

Biceps long head:  Intact.

Acromioclavicular Joint: Mild arthropathy of the acromioclavicular
joint. Trace subacromial-subdeltoid bursal fluid.

Glenohumeral Joint: Mild irregularity at the posterior chondrolabral
junction. No joint effusion.

Labrum: Chronic tear of the posterosuperior labrum (series 3, images
7-9), similar to previous MRI. No paralabral cyst.

Bones: Reactive subcortical marrow changes at the rotator cuff
footprint. Mild reactive marrow signal change at the AC joint. No
fracture. No dislocation. No suspicious bone lesion.

Other: None.
IMPRESSION: 1. Supraspinatus tendinosis with low-grade bursal sided
fraying/irregularity. Interstitial tear of the far posterior
infraspinatus tendon fibers. No full-thickness rotator cuff tendon
tear.
2. Infraspinatus intramuscular edema, suggestive of muscle strain.
3. Chronic tear of the posterosuperior labrum, similar to previous
MRI.
4. Mild subacromial-subdeltoid bursitis.
5. Mild glenohumeral and acromioclavicular osteoarthritis.

## 2021-04-17 ENCOUNTER — Other Ambulatory Visit (HOSPITAL_COMMUNITY)
Admission: RE | Admit: 2021-04-17 | Discharge: 2021-04-17 | Disposition: A | Discharge status: Home / Self Care | Payer: 59 | Source: Ambulatory Visit | Attending: Adult Health | Admitting: Adult Health

## 2021-04-17 ENCOUNTER — Ambulatory Visit (INDEPENDENT_AMBULATORY_CARE_PROVIDER_SITE_OTHER): Payer: 59 | Admitting: Adult Health

## 2021-04-17 ENCOUNTER — Other Ambulatory Visit: Payer: Self-pay

## 2021-04-17 ENCOUNTER — Encounter: Payer: Self-pay | Admitting: Adult Health

## 2021-04-17 VITALS — BP 103/64 | HR 65 | Ht 63.0 in | Wt 153.6 lb

## 2021-04-17 DIAGNOSIS — R109 Unspecified abdominal pain: Secondary | ICD-10-CM | POA: Diagnosis not present

## 2021-04-17 DIAGNOSIS — Z01419 Encounter for gynecological examination (general) (routine) without abnormal findings: Secondary | ICD-10-CM

## 2021-04-17 DIAGNOSIS — K649 Unspecified hemorrhoids: Secondary | ICD-10-CM

## 2021-04-17 DIAGNOSIS — Z1211 Encounter for screening for malignant neoplasm of colon: Secondary | ICD-10-CM | POA: Diagnosis not present

## 2021-04-17 DIAGNOSIS — R140 Abdominal distension (gaseous): Secondary | ICD-10-CM | POA: Diagnosis not present

## 2021-04-17 DIAGNOSIS — Z1231 Encounter for screening mammogram for malignant neoplasm of breast: Secondary | ICD-10-CM

## 2021-04-17 LAB — HEMOCCULT GUIAC POC 1CARD (OFFICE): Fecal Occult Blood, POC: NEGATIVE

## 2021-04-17 MED ORDER — HYDROCORTISONE ACETATE 25 MG RE SUPP: 25.0000 mg | Freq: Two times a day (BID) | RECTAL | 6 refills | Status: DC | PRN

## 2021-04-17 MED ORDER — PROGESTERONE 200 MG PO CAPS: 200.0000 mg | ORAL_CAPSULE | Freq: Every day | ORAL | 12 refills | Status: DC

## 2021-04-17 NOTE — Progress Notes (Signed)
Patient ID: ZAELA GRALEY, female   DOB: July 23, 1966, 55 y.o.   MRN: [id number] History of Present Illness: Toni Wolf is a 55 year old white female,married, PM in for well woman gyn exam and pap. She is on unopposed estrogen since IUD removed 2-3 months ago. PCP is Dr [name].   Current Medications, Allergies, Past Medical History, Past Surgical History, Family History and Social History were reviewed in [employer] record.     Review of Systems: Patient denies any headaches, hearing loss, fatigue, blurred vision, shortness of breath, chest pain, abdominal pain, problems with bowel movements,(constipated at times, uses  miralax)urination, or intercourse. No joint pain or mood swings.  Has bloating and cramping. She had IUD removed 2-3 months ago.   Physical Exam:BP 103/64 (BP Location: Right Arm, Patient Position: Sitting, Cuff Size: Normal)   Pulse 65   Ht 5\' 3"  (1.6 m)   Wt 153 lb 9.6 oz (69.7 kg)   BMI 27.21 kg/m   General:  Well developed, well nourished, no acute distress Skin:  Warm and dry Neck:  Midline trachea, normal thyroid, good ROM, no lymphadenopathy Lungs; Clear to auscultation bilaterally Breast:  No dominant palpable mass, retraction, or nipple discharge Cardiovascular: Regular rate and rhythm Abdomen:  Soft, non tender, no hepatosplenomegaly Pelvic:  External genitalia is normal in appearance, no lesions.  The vagina is normal in appearance. Urethra has no lesions or masses. The cervix is smooth, pap with HR HPV genotyping performed.  Uterus is felt to be normal size, shape, and contour.  No adnexal masses or tenderness noted.Bladder is non tender, no masses felt. Rectal: Good sphincter tone, no polyps, + hemorrhoids felt.  Hemoccult negative. Extremities/musculoskeletal:  No swelling or varicosities noted, no clubbing or cyanosis Psych:  No mood changes, alert and cooperative,seems happy AA is 0 Fall risk is low Depression screen PHQ 2/9 04/17/2021   Decreased Interest 0  Down, Depressed, Hopeless 0  PHQ - 2 Score 0  Altered sleeping 1  Tired, decreased energy 1  Change in appetite 1  Feeling bad or failure about yourself  0  Trouble concentrating 0  Moving slowly or fidgety/restless 0  Suicidal thoughts 0  PHQ-9 Score 3    GAD 7 : Generalized Anxiety Score 04/17/2021  Nervous, Anxious, on Edge 0  Control/stop worrying 1  Worry too much - different things 0  Trouble relaxing 0  Restless 0  Easily annoyed or irritable 0  Afraid - awful might happen 0  Total GAD 7 Score 1      Upstream - 04/17/21 1448       Pregnancy Intention Screening   Does the patient want to become pregnant in the next year? No    Does the patient's partner want to become pregnant in the next year? No    Would the patient like to discuss contraceptive options today? No      Contraception Wrap Up   Current Method No Contraceptive Precautions   PM   End Method No Contraception Precautions   PM   Contraception Counseling Provided No            Examination chaperoned by Celene Squibb LPN   Impression and Plan: 1. Encounter for gynecological examination with Papanicolaou smear of cervix Pap sent Physical in 1 year Pap in 3 if normal - Cytology - PAP( [facility]) Will rx Prometrium at [facility] with PCP  2. Encounter for screening fecal occult blood testing  - POCT occult blood stool  3. Bloating Pelvic US at [facility] 6/17/at 8:30 am with full bladder - US PELVIC COMPLETE WITH TRANSVAGINAL; Future  4. Abdominal cramping Will get [place] 04/25/21 at [facility] - US PELVIC COMPLETE WITH TRANSVAGINAL; Future  5. Screening mammogram for breast cancer Scheduled mammogram 04/25/21 at 8 am at [facility] She will get the [facility] to send films to [facility]. - MM 3D SCREEN BREAST BILATERAL; Future  6. Hemorrhoids, unspecified hemorrhoid type Will rx anusol supp  Meds ordered this encounter  Medications   progesterone (PROMETRIUM) 200 MG capsule    Sig:  Take 1 capsule (200 mg total) by mouth daily. Take 1 at bedtime    Dispense:  30 capsule    Refill:  12    Order Specific Question:   Supervising Provider    Answer:   Tania Ade H [[id number]]   hydrocortisone (ANUSOL-HC) 25 MG suppository    Sig: Place 1 suppository (25 mg total) rectally 2 (two) times daily as needed for hemorrhoids or anal itching.    Dispense:  12 suppository    Refill:  6    Order Specific Question:   Supervising Provider    Answer:   Tania Ade H [[id number]]

## 2021-04-22 LAB — CYTOLOGY - PAP
Comment: NEGATIVE
Diagnosis: UNDETERMINED — AB
High risk HPV: NEGATIVE

## 2021-04-23 ENCOUNTER — Encounter: Payer: Self-pay | Admitting: Adult Health

## 2021-04-23 ENCOUNTER — Telehealth: Payer: Self-pay | Admitting: Adult Health

## 2021-04-23 DIAGNOSIS — R87610 Atypical squamous cells of undetermined significance on cytologic smear of cervix (ASC-US): Secondary | ICD-10-CM

## 2021-04-23 HISTORY — DX: Atypical squamous cells of undetermined significance on cytologic smear of cervix (ASC-US): R87.610

## 2021-04-23 NOTE — Telephone Encounter (Signed)
Pt aware that pap was negative for HPV, but ASCUS, repeat in 3 years per [facility]

## 2021-04-25 ENCOUNTER — Ambulatory Visit (HOSPITAL_COMMUNITY)
Admission: RE | Admit: 2021-04-25 | Discharge: 2021-04-25 | Disposition: A | Discharge status: Home / Self Care | Payer: 59 | Source: Ambulatory Visit | Attending: Adult Health | Admitting: Adult Health

## 2021-04-25 DIAGNOSIS — R109 Unspecified abdominal pain: Secondary | ICD-10-CM

## 2021-04-25 DIAGNOSIS — Z1231 Encounter for screening mammogram for malignant neoplasm of breast: Secondary | ICD-10-CM | POA: Diagnosis present

## 2021-04-25 DIAGNOSIS — R140 Abdominal distension (gaseous): Secondary | ICD-10-CM | POA: Diagnosis not present

## 2021-04-28 ENCOUNTER — Telehealth: Payer: Self-pay | Admitting: Adult Health

## 2021-04-28 NOTE — Telephone Encounter (Signed)
Pt aware that [place] normal.

## 2021-05-19 ENCOUNTER — Telehealth: Payer: Self-pay | Admitting: Adult Health

## 2021-05-19 NOTE — Telephone Encounter (Signed)
Pt requesting a call back from [place] 8-8:30am tomorrow Pt states she was taking Progesterone, started having headaches, stopped Progesterone, started having migraines, feels weird like an out of body experience  Please advise  (Routed to [place] due to [place] is gone for the day)   [employer]

## 2021-05-20 ENCOUNTER — Other Ambulatory Visit: Payer: 59 | Admitting: Adult Health

## 2021-05-20 NOTE — Telephone Encounter (Signed)
Pt had migraine stopped promethium still has headache, has seen PCP,so stop the estrogen patch and let me know Next week how she feels

## 2021-05-30 ENCOUNTER — Telehealth: Payer: Self-pay | Admitting: Adult Health

## 2021-05-30 NOTE — Telephone Encounter (Signed)
Pt wanted to let Anderson [place] know that she's doing great since she removed & stopped using the estrogen patch

## 2021-06-02 MED ORDER — FLUCONAZOLE 150 MG PO TABS: ORAL_TABLET | ORAL | 1 refills | Status: DC

## 2021-06-02 NOTE — Telephone Encounter (Signed)
Pt requests diflucan, she has stopped the patch and Prometrium and feels good

## 2021-10-14 ENCOUNTER — Other Ambulatory Visit (HOSPITAL_COMMUNITY)
Admission: RE | Admit: 2021-10-14 | Discharge: 2021-10-14 | Disposition: A | Discharge status: Home / Self Care | Payer: 59 | Source: Ambulatory Visit | Attending: Family Medicine | Admitting: Family Medicine

## 2021-10-14 DIAGNOSIS — R1110 Vomiting, unspecified: Secondary | ICD-10-CM | POA: Insufficient documentation

## 2021-10-17 LAB — H. PYLORI ANTIGEN, STOOL: H. Pylori Stool Ag, Eia: NEGATIVE

## 2021-10-23 ENCOUNTER — Encounter (INDEPENDENT_AMBULATORY_CARE_PROVIDER_SITE_OTHER): Payer: Self-pay | Admitting: *Deleted

## 2021-11-14 ENCOUNTER — Other Ambulatory Visit: Payer: Self-pay | Admitting: Adult Health

## 2021-11-14 MED ORDER — PROGESTERONE 200 MG PO CAPS: ORAL_CAPSULE | ORAL | 3 refills | Status: DC

## 2021-11-14 NOTE — Progress Notes (Signed)
Will rx Prometrium,pt going to resume estrogen patches

## 2021-11-17 ENCOUNTER — Other Ambulatory Visit: Payer: Self-pay

## 2021-11-17 ENCOUNTER — Ambulatory Visit (INDEPENDENT_AMBULATORY_CARE_PROVIDER_SITE_OTHER): Payer: 59 | Admitting: Gastroenterology

## 2021-11-17 ENCOUNTER — Encounter (INDEPENDENT_AMBULATORY_CARE_PROVIDER_SITE_OTHER): Payer: Self-pay | Admitting: Gastroenterology

## 2021-11-17 VITALS — BP 96/60 | HR 63 | Temp 98.3°F | Ht 63.0 in | Wt 153.3 lb

## 2021-11-17 DIAGNOSIS — K219 Gastro-esophageal reflux disease without esophagitis: Secondary | ICD-10-CM

## 2021-11-17 MED ORDER — SUCRALFATE 1 G PO TABS: 1.0000 g | ORAL_TABLET | Freq: Three times a day (TID) | ORAL | 1 refills | Status: DC

## 2021-11-17 NOTE — Progress Notes (Signed)
Referring Provider: Caryl Bis, MD Primary Care Physician:  Caryl Bis, MD Primary GI Physician: [facility]  Chief Complaint  Patient presents with   Gastroesophageal Reflux    Patient states she is having trouble with reflux. Was on nexium bid and was switched to protonix 40mg  bid and pepcid. States nausea is better.    HPI:   Toni Wolf is a 56 y.o. female with past medical history of anxiety, GERD.   Patient presenting today for GERD/Nausea.  Patient states that she had covid about 4 months ago, for about one month afterwards, she was noticing nausea everytime she ate as well as a "raw" feeling when swallowing, with dry cough and some dysphagia. She reports that nausea typically was after she ate, she denied any episodes of vomiting, rectal bleeding, diarrhea, weight loss, early satiety or abdominal pain. She endorses more gurgling in her stomach and some in her throat. She states that she was on nexium BID, her PCP added pepcid in with this regimen, however symptoms did not improve, she was then switched to pantoprazole twice a day with significant improvement in her symptoms. Nausea has now resolved, as has dysphagia. She still has an occasional dry cough, with foul taste in her mouth upon waking and raw feeling in her throat though she does not necessarily notice heart burn, occasionally feels acid regurgitation. She has tried to cut out trigger foods/drinks and has cut back on her coffee intake as well. States appetite is good and weight is stable.  Patient had H. pylori stool antigen done 10/15/21 which was negative. She has occasional constipation which she uses miralax 17g for PRN with good result.   Last Colonoscopy:03/13/21- One 3 mm polyp in the descending colon-tubular adenoma - One 6 mm polyp in the sigmoid colon-tubular adenoma - External hemorrhoids. - Anal papilla(e) were hypertrophied. Last Endoscopy: [facility], unsure of last date  Recommendations:   Repeat colonoscopy 5 years  Past Medical History:  Diagnosis Date   Anxiety    ASCUS of cervix with negative high risk HPV 04/23/2021   04/23/21 repeat pap in 3 years per [facility]   GERD (gastroesophageal reflux disease)    Seasonal allergies     Past Surgical History:  Procedure Laterality Date   BIOPSY  03/13/2021   Procedure: BIOPSY;  Surgeon: Rogene Houston, MD;  [facility];  Service: Endoscopy;;  descending colon polyp   CHOLECYSTECTOMY     COLONOSCOPY N/A 01/23/2016   Procedure: COLONOSCOPY;  Surgeon: Rogene Houston, MD;  [facility];  Service: Endoscopy;  Laterality: N/A;  830   COLONOSCOPY N/A 03/13/2021   Procedure: COLONOSCOPY;  Surgeon: Rogene Houston, MD;  [facility];  Service: Endoscopy;  Laterality: N/A;  am   LAPAROSCOPY     NASAL SEPTUM SURGERY     POLYPECTOMY  03/13/2021   Procedure: POLYPECTOMY;  Surgeon: Rogene Houston, MD;  [facility];  Service: Endoscopy;;  sigmoid colon polyp    Current Outpatient Medications  Medication Sig Dispense Refill   Calcium Carbonate-Vitamin D (CALCIUM 600+D PO) Take by mouth.     escitalopram (LEXAPRO) 5 MG tablet Take 5 mg by mouth daily.     estradiol (VIVELLE-DOT) 0.0375 MG/24HR Place 1 patch onto the skin 2 (two) times a week.     fluticasone (FLONASE) 50 MCG/ACT nasal spray Place 1 spray into both nostrils daily as needed for allergies or rhinitis.     hydrocortisone (  ANUSOL-HC) 25 MG suppository Place 1 suppository (25 mg total) rectally 2 (two) times daily as needed for hemorrhoids or anal itching. 12 suppository 6   levocetirizine (XYZAL) 5 MG tablet Take 5 mg by mouth every evening.     Multiple Vitamins-Minerals (MULTIVITAMIN WITH MINERALS) tablet Take 1 tablet by mouth daily.     polyethylene glycol (MIRALAX / GLYCOLAX) 17 g packet Take 17 g by mouth daily.     PROCTOSOL HC 2.5 % rectal cream Place rectally 2 (two) times daily.     progesterone (PROMETRIUM) 200 MG capsule  Take 1 at bedtime 30 capsule 3   Propylene Glycol (SYSTANE COMPLETE) 0.6 % SOLN Place 1 drop into both eyes in the morning and at bedtime.     vitamin B-12 (CYANOCOBALAMIN) 100 MCG tablet Take 100 mcg by mouth daily.     No current facility-administered medications for this visit.    Allergies as of 11/17/2021 - Review Complete 11/17/2021  Allergen Reaction Noted   Latex  04/17/2021    Family History  Problem Relation Age of Onset   Heart disease Paternal Grandfather    Diabetes Paternal Grandfather    Heart disease Maternal Grandmother    Diabetes Maternal Grandmother    Diabetes Father    Heart disease Father    COPD Father    Crohn's disease Mother    Cirrhosis Mother    Arthritis Sister    Colon cancer Neg Hx     Social History   Socioeconomic History   Marital status: Married    Spouse name: Not on file   Number of children: Not on file   Years of education: Not on file   Highest education level: Not on file  Occupational History   Not on file  Tobacco Use   Smoking status: Never   Smokeless tobacco: Never  Vaping Use   Vaping Use: Never used  Substance and Sexual Activity   Alcohol use: Not Currently   Drug use: No   Sexual activity: Not Currently    Birth control/protection: Post-menopausal  Other Topics Concern   Not on file  Social History Narrative   Not on file   Social Determinants of Health   Financial Resource Strain: Low Risk    Difficulty of Paying Living Expenses: Not hard at all  Food Insecurity: No Food Insecurity   Worried About Charity fundraiser in the Last Year: Never true   [place] in the Last Year: Never true  Transportation Needs: No Transportation Needs   Lack of Transportation (Medical): No   Lack of Transportation (Non-Medical): No  Physical Activity: Insufficiently Active   Days of Exercise per Week: 3 days   Minutes of Exercise per Session: 40 min  Stress: No Stress Concern Present   Feeling of Stress : Not at  all  Social Connections: Socially Integrated   Frequency of Communication with Friends and Family: More than three times a week   Frequency of Social Gatherings with Friends and Family: Once a week   Attends Religious Services: More than 4 times per year   Active Member of [employer] or Organizations: Yes   Attends Music therapist: More than 4 times per year   Marital Status: Married   Review of systems General: negative for malaise, night sweats, fever, chills, weight loss Neck: Negative for lumps, goiter, pain and significant neck swelling Resp: Negative for wheezing, dyspnea at rest +dry cough CV: Negative for chest pain, leg swelling,  palpitations, orthopnea GI: denies melena, hematochezia, nausea, vomiting, diarrhea, dysphagia, odyonophagia, early satiety or unintentional weight loss. +constipation +raw feeling in throat +foul taste in mouth upon waking +acid regurgitation MSK: Negative for joint pain or swelling, back pain, and muscle pain. Derm: Negative for itching or rash Psych: Denies depression, anxiety, memory loss, confusion. No homicidal or suicidal ideation.  Heme: Negative for prolonged bleeding, bruising easily, and swollen nodes. Endocrine: Negative for cold or heat intolerance, polyuria, polydipsia and goiter. Neuro: negative for tremor, gait imbalance, syncope and seizures. The remainder of the review of systems is noncontributory.  Physical Exam: BP 96/60 (BP Location: Right Arm, Patient Position: Sitting, Cuff Size: Large)    Pulse 63    Temp 98.3 F (36.8 C) (Oral)    Ht 5\' 3"  (1.6 m)    Wt 153 lb 4.8 oz (69.5 kg)    BMI 27.16 kg/m  General:   Alert and oriented. No distress noted. Pleasant and cooperative.  Head:  Normocephalic and atraumatic. Eyes:  Conjuctiva clear without scleral icterus. Mouth:  Oral mucosa pink and moist. Good dentition. No lesions. Heart: Normal rate and rhythm, s1 and s2 heart sounds present.  Lungs: Clear lung sounds in all  lobes. Respirations equal and unlabored. Abdomen:  +BS, soft, non-tender and non-distended. No rebound or guarding. No HSM or masses noted. Derm: No palmar erythema or jaundice Msk:  Symmetrical without gross deformities. Normal posture. Extremities:  Without edema. Neurologic:  Alert and  oriented x4 Psych:  Alert and cooperative. Normal mood and affect.  Invalid input(s): 6 MONTHS   ASSESSMENT: Toni Wolf is a 56 y.o. female presenting today for uncontrolled GERD/nausea.  GERD/Nausea/dysphagia began after patient had covid, since switching to protonix 40mg  BID, she reports a significant improvement in her symptoms which is reassuring. She has had no red flag symptoms. I discussed with patient that we could proceed with EGD for further evaluation to ensure there is no evidence of esophagitis/gastritis, as I suspect this is what she was experiencing r/t uncontrolled GERD worsened by COVID,  patient prefers to hold off on EGD at this time, which is reasonable. We will continue with PPI BID and add carafate 1g QID. She should avoid trigger foods/drinks, stay upright 2-3 hours after eating and be mindful of eating spicy, greasy, tomato based foods, as well as caffeine intake. She will let me know if she develops any new or worsening symptoms. I will see her back in 6-8 weeks, we will discuss EGD further at that time if symptoms are not improved.    PLAN:  Continue PPI BID, 30 minutes prior to breakfast and 30 minutes prior to dinner 2. Reflux precautions 3. Carafate 1g tablet (mix with 1 oz water, drink slurry before meals and at bedtime) 4. Consider EGD if symptoms fail to improve or new GI symptoms occur   Follow Up: 6-8 weeks  Robertta Halfhill L. Alver Sorrow, MSN, APRN, AGNP-C Adult-Gerontology Nurse Practitioner [facility]

## 2021-11-17 NOTE — Patient Instructions (Signed)
Please continue your protonix 30 minutes before breakfast and 30 minutes before dinner. I have sent carafate 1g tablets to your pharmacy, please dissolve 1 tablet in 1 oz of water, mix and drink slurry prior to meals and before bed. Please avoid trigger foods such as spicy, greasy, tomato based foods, alcohol and caffeine. Stay upright 2-3 hours after eating, prior to lying down. Please let me know if you develop and new or worsening symptoms. If your symptoms fail to resolve or you develop any new symptoms, we will proceed with EGD as discussed.  Follow up 6-8 weeks.

## 2021-11-19 ENCOUNTER — Other Ambulatory Visit: Payer: Self-pay | Admitting: Adult Health

## 2021-11-19 MED ORDER — ESTRADIOL 0.0375 MG/24HR TD PTTW: 1.0000 | MEDICATED_PATCH | TRANSDERMAL | 12 refills | Status: DC

## 2021-11-19 NOTE — Progress Notes (Signed)
Rx sent to [facility] for patch

## 2021-12-04 ENCOUNTER — Telehealth (INDEPENDENT_AMBULATORY_CARE_PROVIDER_SITE_OTHER): Payer: Self-pay

## 2021-12-04 NOTE — Telephone Encounter (Signed)
Patient called stating she has had a runny nose and sore throat for a week and a half and the drainage has caused her reflux to act up she is hoarse also. She states she is taking Pantoprazole 40 mg bid thirty minutes prior to her meals She is also taking Carafate QID.  She states she recently was started on the pantoprazole a couple of months ago. If something is called in for her she would like it to go to [facility] [place]. Thanks

## 2021-12-05 NOTE — Telephone Encounter (Signed)
Patient aware of all.

## 2021-12-13 ENCOUNTER — Encounter (INDEPENDENT_AMBULATORY_CARE_PROVIDER_SITE_OTHER): Payer: Self-pay

## 2021-12-24 ENCOUNTER — Other Ambulatory Visit: Payer: Self-pay | Admitting: Adult Health

## 2021-12-24 MED ORDER — PROGESTERONE MICRONIZED 100 MG PO CAPS: 100.0000 mg | ORAL_CAPSULE | Freq: Every day | ORAL | 0 refills | Status: DC

## 2021-12-24 NOTE — Progress Notes (Signed)
Will try 100 mg of prometrium

## 2021-12-30 ENCOUNTER — Other Ambulatory Visit: Payer: Self-pay | Admitting: Adult Health

## 2022-01-02 ENCOUNTER — Other Ambulatory Visit: Payer: Self-pay | Admitting: Adult Health

## 2022-01-05 ENCOUNTER — Other Ambulatory Visit: Payer: Self-pay | Admitting: Adult Health

## 2022-01-05 MED ORDER — PROGESTERONE MICRONIZED 100 MG PO CAPS: 100.0000 mg | ORAL_CAPSULE | Freq: Every day | ORAL | 3 refills | Status: DC

## 2022-01-05 NOTE — Progress Notes (Signed)
Refilled [facility] to [facility]

## 2022-01-06 DIAGNOSIS — R21 Rash and other nonspecific skin eruption: Secondary | ICD-10-CM | POA: Diagnosis not present

## 2022-01-13 ENCOUNTER — Other Ambulatory Visit: Payer: Self-pay

## 2022-01-13 ENCOUNTER — Encounter (INDEPENDENT_AMBULATORY_CARE_PROVIDER_SITE_OTHER): Payer: Self-pay

## 2022-01-13 ENCOUNTER — Other Ambulatory Visit (INDEPENDENT_AMBULATORY_CARE_PROVIDER_SITE_OTHER): Payer: Self-pay

## 2022-01-13 ENCOUNTER — Ambulatory Visit (INDEPENDENT_AMBULATORY_CARE_PROVIDER_SITE_OTHER): Payer: Self-pay | Admitting: Gastroenterology

## 2022-01-13 ENCOUNTER — Encounter (INDEPENDENT_AMBULATORY_CARE_PROVIDER_SITE_OTHER): Payer: Self-pay | Admitting: Gastroenterology

## 2022-01-13 VITALS — BP 101/64 | HR 86 | Temp 98.3°F | Ht 63.0 in | Wt 156.3 lb

## 2022-01-13 DIAGNOSIS — K219 Gastro-esophageal reflux disease without esophagitis: Secondary | ICD-10-CM

## 2022-01-13 DIAGNOSIS — R1310 Dysphagia, unspecified: Secondary | ICD-10-CM

## 2022-01-13 DIAGNOSIS — R143 Flatulence: Secondary | ICD-10-CM

## 2022-01-13 NOTE — Patient Instructions (Signed)
Please continue your pantoprazole twice a day as well as pepcid at night ?Please continue to avoid trigger foods such as tomato based, fried, spicy, greasy foods, as well as carbonated drinks, chocolate and alcohol. ?Stay upright 2-3 hours after eating prior to lying down ?You can try adding in a probiotic, I prefer lactobacillus rhamnosus, the brand itself does not matter. ?We will get you scheduled for EGD for further evaluation of your symptoms. ? ?Follow up 3 months ?

## 2022-01-13 NOTE — Progress Notes (Cosign Needed)
Referring Provider: Caryl Bis, MD Primary Care Physician:  Caryl Bis, MD Primary GI Physician: [facility]  Chief Complaint  Patient presents with   Gastroesophageal Reflux    GERD. Follow up on GERD. Nausea is better still having some burning.    HPI:   Toni Wolf is a 56 y.o. female with past medical history of anxiety and GERD.  Patient presenting today for GERD follow up.  Patient last seen by me 11/17/21 with nausea and raw feeling in her throat with swallowing after having covid. She c/o some dysphagia and dry cough. She was on protonix BID at that time with some improvement since switching from nexium, however, she was still waking up with a raw throat and foul taste in her mouth. She was continued on protonix BID with carafate 1g QID added, considerations for proceeding with EGD if symptoms failed to improve at follow up.  Today, she states that nausea has resolved. She is still having some issues with heartburn and sometimes discomfort in her chest area when she swallows, notably worse with certain foods such as tomato based or coffee. She is still having some issues with waking up with a foul taste in her mouth and a raw feeling in her throat, noting she feels that acid is still coming up in her throat some at night. She denies dysphagia. States that she took the carafate for a few weeks and did not really notice any improvement, she also had some itching that began after starting the medication, for which she reached out to our office and was advised to stop the carafate in case the itching was an adverse reaction to the medication.  She has noticed more gas with certain foods, especially whole grain foods and rice cakes, that she previously tolerated without issue. She is using gas-x which provides some relief.. She is taking pepcid at bedtime which also has helped some, though reports she sometimes forgets to take this. She is taking some stool softeners at bedtime for  chronic constipation which seems to work well for her. Bowels are moving regularly without rectal bleeding or melena.  Last Colonoscopy:03/13/21- One 3 mm polyp in the descending colon-tubular adenoma - One 6 mm polyp in the sigmoid colon-tubular adenoma - External hemorrhoids. - Anal papilla(e) were hypertrophied. Last Endoscopy: [facility], unsure of last date  Past Medical History:  Diagnosis Date   Anxiety    ASCUS of cervix with negative high risk HPV 04/23/2021   04/23/21 repeat pap in 3 years per [facility]   GERD (gastroesophageal reflux disease)    Seasonal allergies     Past Surgical History:  Procedure Laterality Date   BIOPSY  03/13/2021   Procedure: BIOPSY;  Surgeon: Rogene Houston, MD;  [facility];  Service: Endoscopy;;  descending colon polyp   CHOLECYSTECTOMY     COLONOSCOPY N/A 01/23/2016   Procedure: COLONOSCOPY;  Surgeon: Rogene Houston, MD;  [facility];  Service: Endoscopy;  Laterality: N/A;  830   COLONOSCOPY N/A 03/13/2021   Rehman: 33m tubular adenoma in descending colon, 626mtubular adenoma in sigmoid colon, external hemorrhoids, anal papillae hypertrophied   LAPAROSCOPY     NASAL SEPTUM SURGERY     POLYPECTOMY  03/13/2021   Procedure: POLYPECTOMY;  Surgeon: ReRogene HoustonMD;  [facility];  Service: Endoscopy;;  sigmoid colon polyp    Current Outpatient Medications  Medication Sig Dispense Refill   Calcium Carbonate-Vitamin D (CALCIUM 600+D  PO) Take by mouth.     escitalopram (LEXAPRO) 5 MG tablet Take 2.5 mg by mouth daily. Takes one  half of 5 mg tablet     estradiol (VIVELLE-DOT) 0.0375 MG/24HR Place 1 patch onto the skin 2 (two) times a week. 8 patch 12   Famotidine (PEPCID PO) Take by mouth. Pepcid complete at bedtime.     fluticasone (FLONASE) 50 MCG/ACT nasal spray Place 1 spray into both nostrils daily as needed for allergies or rhinitis.     hydrocortisone (ANUSOL-HC) 25 MG suppository Place 1 suppository  (25 mg total) rectally 2 (two) times daily as needed for hemorrhoids or anal itching. 12 suppository 6   levocetirizine (XYZAL) 5 MG tablet Take 5 mg by mouth every morning.     Multiple Vitamins-Minerals (MULTIVITAMIN WITH MINERALS) tablet Take 1 tablet by mouth daily.     OVER THE COUNTER MEDICATION Stool softner 2 qhs stimulant free     pantoprazole (PROTONIX) 40 MG tablet Take 40 mg by mouth 2 (two) times daily.     polyethylene glycol (MIRALAX / GLYCOLAX) 17 g packet Take 17 g by mouth daily as needed.     PROCTOSOL HC 2.5 % rectal cream Place rectally 2 (two) times daily.     progesterone (PROMETRIUM) 100 MG capsule Take 1 capsule (100 mg total) by mouth at bedtime. 90 capsule 3   Propylene Glycol (SYSTANE COMPLETE) 0.6 % SOLN Place 1 drop into both eyes in the morning and at bedtime.     vitamin B-12 (CYANOCOBALAMIN) 100 MCG tablet Take 100 mcg by mouth daily.     sucralfate (CARAFATE) 1 g tablet Take 1 tablet (1 g total) by mouth 4 (four) times daily -  with meals and at bedtime. (Patient not taking: Reported on 01/13/2022) 120 tablet 1   No current facility-administered medications for this visit.    Allergies as of 01/13/2022 - Review Complete 01/13/2022  Allergen Reaction Noted   Latex  04/17/2021    Family History  Problem Relation Age of Onset   Heart disease Paternal Grandfather    Diabetes Paternal Grandfather    Heart disease Maternal Grandmother    Diabetes Maternal Grandmother    Diabetes Father    Heart disease Father    COPD Father    Crohn's disease Mother    Cirrhosis Mother    Arthritis Sister    Colon cancer Neg Hx     Social History   Socioeconomic History   Marital status: Married    Spouse name: Not on file   Number of children: Not on file   Years of education: Not on file   Highest education level: Not on file  Occupational History   Not on file  Tobacco Use   Smoking status: Never    Passive exposure: Never   Smokeless tobacco: Never   Vaping Use   Vaping Use: Never used  Substance and Sexual Activity   Alcohol use: Not Currently   Drug use: No   Sexual activity: Not Currently    Birth control/protection: Post-menopausal  Other Topics Concern   Not on file  Social History Narrative   Not on file   Social Determinants of Health   Financial Resource Strain: Low Risk    Difficulty of Paying Living Expenses: Not hard at all  Food Insecurity: No Food Insecurity   Worried About Charity fundraiser in the Last Year: Never true   [place] in the Last Year: Never true  Transportation Needs: No Data processing manager (Medical): No   Lack of Transportation (Non-Medical): No  Physical Activity: Insufficiently Active   Days of Exercise per Week: 3 days   Minutes of Exercise per Session: 40 min  Stress: No Stress Concern Present   Feeling of Stress : Not at all  Social Connections: Socially Integrated   Frequency of Communication with Friends and Family: More than three times a week   Frequency of Social Gatherings with Friends and Family: Once a week   Attends Religious Services: More than 4 times per year   Active Member of [employer] or Organizations: Yes   Attends Music therapist: More than 4 times per year   Marital Status: Married   Review of systems General: negative for malaise, night sweats, fever, chills, weight loss Neck: Negative for lumps, goiter, pain and significant neck swelling Resp: Negative for cough, wheezing, dyspnea at rest CV: Negative for chest pain, leg swelling, palpitations, orthopnea GI: denies melena, hematochezia, nausea, vomiting, diarrhea, dysphagia, early satiety or unintentional weight loss. +odynophagia +acid regurgitation +raw throat +flatulence  MSK: Negative for joint pain or swelling, back pain, and muscle pain. Derm: Negative for itching or rash Psych: Denies depression, anxiety, memory loss, confusion. No homicidal or suicidal ideation.   Heme: Negative for prolonged bleeding, bruising easily, and swollen nodes. Endocrine: Negative for cold or heat intolerance, polyuria, polydipsia and goiter. Neuro: negative for tremor, gait imbalance, syncope and seizures. The remainder of the review of systems is noncontributory.  Physical Exam: BP 101/64 (BP Location: Right Arm, Patient Position: Sitting, Cuff Size: Large)    Pulse 86    Temp 98.3 F (36.8 C) (Oral)    Ht '5\' 3"'$  (1.6 m)    Wt 156 lb 4.8 oz (70.9 kg)    BMI 27.69 kg/m  General:   Alert and oriented. No distress noted. Pleasant and cooperative.  Head:  Normocephalic and atraumatic. Eyes:  Conjuctiva clear without scleral icterus. Mouth:  Oral mucosa pink and moist. Good dentition. No lesions. Heart: Normal rate and rhythm, s1 and s2 heart sounds present.  Lungs: Clear lung sounds in all lobes. Respirations equal and unlabored. Abdomen:  +BS, soft, non-tender and non-distended. No rebound or guarding. No HSM or masses noted. Derm: No palmar erythema or jaundice Msk:  Symmetrical without gross deformities. Normal posture. Extremities:  Without edema. Neurologic:  Alert and  oriented x4 Psych:  Alert and cooperative. Normal mood and affect.  Invalid input(s): 6 MONTHS   ASSESSMENT: Toni Wolf is a 56 y.o. female presenting today for follow up of GERD.  Nausea has resolved, however, patient continues to have some pain in her chest/esophageal area with swallowing and notes acid regurgitation and heartburn, especially with certain foods, though she is trying to avoid trigger foods such as coffee and tomato based things. She does feel that the switch to protonix BID has helped some and continues to use pepcid at night. She is still having a foul taste in her mouth and some rawness in her throat as well. I discussed with patient proceeding with EGD for further evaluation of her symptoms as she has had some improvement but not resolution. At this time, we cannot rule out  esophagitis vs uncontrolled reflux that was worsened post-covid. Indications, risks and benefits of procedure discussed in detail with patient. Patient verbalized understanding and is in agreement to proceed with EGD at this time.    PLAN:  Continue Protonix  BID 2.  Continue Pepcid QHS 3. Continue Reflux precautions 4. Schedule EGD 5. Start probiotic-Lactobacillus rhamnosus  6. Continue gas-ex as needed   Follow Up: 3 months  Herschell Virani L. Alver Sorrow, MSN, APRN, AGNP-C Adult-Gerontology Nurse Practitioner [facility]

## 2022-01-30 DIAGNOSIS — Z1329 Encounter for screening for other suspected endocrine disorder: Secondary | ICD-10-CM | POA: Diagnosis not present

## 2022-01-30 DIAGNOSIS — R5383 Other fatigue: Secondary | ICD-10-CM | POA: Diagnosis not present

## 2022-01-30 DIAGNOSIS — K219 Gastro-esophageal reflux disease without esophagitis: Secondary | ICD-10-CM | POA: Diagnosis not present

## 2022-01-30 DIAGNOSIS — Z0001 Encounter for general adult medical examination with abnormal findings: Secondary | ICD-10-CM | POA: Diagnosis not present

## 2022-02-06 DIAGNOSIS — L2084 Intrinsic (allergic) eczema: Secondary | ICD-10-CM | POA: Diagnosis not present

## 2022-02-06 DIAGNOSIS — R69 Illness, unspecified: Secondary | ICD-10-CM | POA: Diagnosis not present

## 2022-02-06 DIAGNOSIS — K2100 Gastro-esophageal reflux disease with esophagitis, without bleeding: Secondary | ICD-10-CM | POA: Diagnosis not present

## 2022-02-06 DIAGNOSIS — J301 Allergic rhinitis due to pollen: Secondary | ICD-10-CM | POA: Diagnosis not present

## 2022-02-06 DIAGNOSIS — Z6827 Body mass index (BMI) 27.0-27.9, adult: Secondary | ICD-10-CM | POA: Diagnosis not present

## 2022-02-06 DIAGNOSIS — Z0001 Encounter for general adult medical examination with abnormal findings: Secondary | ICD-10-CM | POA: Diagnosis not present

## 2022-02-12 ENCOUNTER — Ambulatory Visit (HOSPITAL_COMMUNITY)
Admission: RE | Admit: 2022-02-12 | Discharge: 2022-02-12 | Disposition: A | Discharge status: Home / Self Care | Payer: 59 | Attending: Internal Medicine | Admitting: Internal Medicine

## 2022-02-12 ENCOUNTER — Ambulatory Visit (HOSPITAL_COMMUNITY): Payer: 59 | Admitting: Anesthesiology

## 2022-02-12 ENCOUNTER — Encounter (HOSPITAL_COMMUNITY)
Admission: RE | Disposition: A | Discharge status: Home / Self Care | Payer: Self-pay | Source: Home / Self Care | Attending: Internal Medicine

## 2022-02-12 ENCOUNTER — Encounter (HOSPITAL_COMMUNITY): Payer: Self-pay | Admitting: Internal Medicine

## 2022-02-12 ENCOUNTER — Ambulatory Visit (HOSPITAL_BASED_OUTPATIENT_CLINIC_OR_DEPARTMENT_OTHER): Payer: 59 | Admitting: Anesthesiology

## 2022-02-12 ENCOUNTER — Other Ambulatory Visit: Payer: Self-pay

## 2022-02-12 DIAGNOSIS — K2970 Gastritis, unspecified, without bleeding: Secondary | ICD-10-CM

## 2022-02-12 DIAGNOSIS — K219 Gastro-esophageal reflux disease without esophagitis: Secondary | ICD-10-CM | POA: Insufficient documentation

## 2022-02-12 DIAGNOSIS — Z8616 Personal history of COVID-19: Secondary | ICD-10-CM | POA: Insufficient documentation

## 2022-02-12 DIAGNOSIS — K449 Diaphragmatic hernia without obstruction or gangrene: Secondary | ICD-10-CM | POA: Diagnosis not present

## 2022-02-12 DIAGNOSIS — K2100 Gastro-esophageal reflux disease with esophagitis, without bleeding: Secondary | ICD-10-CM | POA: Diagnosis not present

## 2022-02-12 DIAGNOSIS — R69 Illness, unspecified: Secondary | ICD-10-CM | POA: Diagnosis not present

## 2022-02-12 DIAGNOSIS — K319 Disease of stomach and duodenum, unspecified: Secondary | ICD-10-CM | POA: Diagnosis not present

## 2022-02-12 DIAGNOSIS — Z79899 Other long term (current) drug therapy: Secondary | ICD-10-CM | POA: Diagnosis not present

## 2022-02-12 DIAGNOSIS — K2289 Other specified disease of esophagus: Secondary | ICD-10-CM | POA: Diagnosis not present

## 2022-02-12 DIAGNOSIS — F419 Anxiety disorder, unspecified: Secondary | ICD-10-CM

## 2022-02-12 DIAGNOSIS — R1310 Dysphagia, unspecified: Secondary | ICD-10-CM

## 2022-02-12 HISTORY — PX: ESOPHAGOGASTRODUODENOSCOPY (EGD) WITH PROPOFOL: SHX5813

## 2022-02-12 HISTORY — PX: BIOPSY: SHX5522

## 2022-02-12 SURGERY — ESOPHAGOGASTRODUODENOSCOPY (EGD) WITH PROPOFOL
Anesthesia: General

## 2022-02-12 MED ORDER — PROPOFOL 10 MG/ML IV BOLUS
INTRAVENOUS | Status: DC | PRN
  Administered 2022-02-12: 100 mg via INTRAVENOUS

## 2022-02-12 MED ORDER — LIDOCAINE HCL (CARDIAC) PF 100 MG/5ML IV SOSY
PREFILLED_SYRINGE | INTRAVENOUS | Status: DC | PRN
Start: 2022-02-12 — End: 2022-02-12
  Administered 2022-02-12: 50 mg via INTRAVENOUS

## 2022-02-12 MED ORDER — LACTATED RINGERS IV SOLN: INTRAVENOUS | Status: DC

## 2022-02-12 MED ORDER — PROPOFOL 500 MG/50ML IV EMUL
INTRAVENOUS | Status: DC | PRN
  Administered 2022-02-12: 150 ug/kg/min via INTRAVENOUS

## 2022-02-12 NOTE — Transfer of Care (Signed)
Immediate Anesthesia Transfer of Care Note ? ?Patient: Toni Wolf ? ?Procedure(s) Performed: ESOPHAGOGASTRODUODENOSCOPY (EGD) WITH PROPOFOL ?BIOPSY ? ?Patient Location: PACU ? ?Anesthesia Type:General ? ?Level of Consciousness: awake, alert , oriented and patient cooperative ? ?Airway & Oxygen Therapy: Patient Spontanous Breathing ? ?Post-op Assessment: Report given to RN, Post -op Vital signs reviewed and stable and Patient moving all extremities X 4 ? ?Post vital signs: Reviewed and stable ? ?Last Vitals:  ?Vitals Value Taken Time  ?BP    ?Temp    ?Pulse    ?Resp    ?SpO2    ? ? ?Last Pain:  ?Vitals:  ? 02/12/22 1356  ?TempSrc:   ?PainSc: 0-No pain  ?   ? ?Patients Stated Pain Goal: 7 (02/12/22 1136) ? ?Complications: No notable events documented. ?

## 2022-02-12 NOTE — H&P (Signed)
Toni Wolf is an 56 y.o. female.   ?Chief Complaint: Patient is here for esophagogastroduodenoscopy ?HPI: Patient is 56 year old Caucasian female with several year history of GERD with been doing well on Nexium until she had COVID 5 or 6 months ago.  Back in December she also developed dysphagia which has improved.  She has been on pantoprazole twice daily for about 3 months but is still having frequent heartburn.  She also has had some nausea.  No history of epigastric pain melena.  She has lost few pounds voluntarily.  No history of NSAID use. ?She does not smoke cigarettes. ? ? ?Past Medical History:  ?Diagnosis Date  ? Anxiety   ? ASCUS of cervix with negative high risk HPV 04/23/2021  ? 04/23/21 repeat pap in 3 years per [facility]  ? GERD (gastroesophageal reflux disease)   ? Seasonal allergies   ? ? ?Past Surgical History:  ?Procedure Laterality Date  ? BIOPSY  03/13/2021  ? Procedure: BIOPSY;  Surgeon: Rogene Houston, MD;  Location: [facility];  Service: Endoscopy;;  descending colon polyp  ? CHOLECYSTECTOMY    ? COLONOSCOPY N/A 01/23/2016  ? Procedure: COLONOSCOPY;  Surgeon: Rogene Houston, MD;  Location: [facility];  Service: Endoscopy;  Laterality: N/A;  830  ? COLONOSCOPY N/A 03/13/2021  ? Stephana Morell: 29m tubular adenoma in descending colon, 659mtubular adenoma in sigmoid colon, external hemorrhoids, anal papillae hypertrophied  ? LAPAROSCOPY    ? NASAL SEPTUM SURGERY    ? POLYPECTOMY  03/13/2021  ? Procedure: POLYPECTOMY;  Surgeon: ReRogene HoustonMD;  Location: [facility];  Service: Endoscopy;;  sigmoid colon polyp  ? ? ?Family History  ?Problem Relation Age of Onset  ? Heart disease Paternal Grandfather   ? Diabetes Paternal Grandfather   ? Heart disease Maternal Grandmother   ? Diabetes Maternal Grandmother   ? Diabetes Father   ? Heart disease Father   ? COPD Father   ? Crohn's disease Mother   ? Cirrhosis Mother   ? Arthritis Sister   ? Colon cancer Neg Hx   ? ?Social History:  reports  that she has never smoked. She has never been exposed to tobacco smoke. She has never used smokeless tobacco. She reports that she does not currently use alcohol. She reports that she does not use drugs. ? ?Allergies:  ?Allergies  ?Allergen Reactions  ? Latex Rash  ? ? ?Medications Prior to Admission  ?Medication Sig Dispense Refill  ? Calcium Carbonate-Vitamin D (CALCIUM 600+D PO) Take 1 tablet by mouth daily.    ? docusate sodium (COLACE) 100 MG capsule Take 100 mg by mouth at bedtime.    ? escitalopram (LEXAPRO) 10 MG tablet Take 5 mg by mouth at bedtime.    ? estradiol (VIVELLE-DOT) 0.0375 MG/24HR Place 1 patch onto the skin 2 (two) times a week. 8 patch 12  ? fluticasone (FLONASE) 50 MCG/ACT nasal spray Place 1 spray into both nostrils 2 (two) times daily as needed for allergies or rhinitis.    ? hydrocortisone (ANUSOL-HC) 25 MG suppository Place 1 suppository (25 mg total) rectally 2 (two) times daily as needed for hemorrhoids or anal itching. 12 suppository 6  ? levocetirizine (XYZAL) 5 MG tablet Take 5 mg by mouth every morning.    ? meclizine (ANTIVERT) 12.5 MG tablet Take 12.5 mg by mouth every 8 (eight) hours as needed for dizziness.    ? Multiple Vitamins-Minerals (MULTIVITAMIN WITH MINERALS) tablet Take 1 tablet by mouth  daily.    ? pantoprazole (PROTONIX) 40 MG tablet Take 40 mg by mouth 2 (two) times daily.    ? polyethylene glycol (MIRALAX / GLYCOLAX) 17 g packet Take 17 g by mouth daily as needed for mild constipation.    ? PROCTOSOL HC 2.5 % rectal cream Place 1 application. rectally 2 (two) times daily as needed for hemorrhoids.    ? progesterone (PROMETRIUM) 100 MG capsule Take 1 capsule (100 mg total) by mouth at bedtime. 90 capsule 3  ? Propylene Glycol (SYSTANE COMPLETE) 0.6 % SOLN Place 1 drop into both eyes in the morning and at bedtime.    ? triamcinolone cream (KENALOG) 0.1 % Apply 1 application. topically 3 (three) times daily as needed for rash.    ? vitamin B-12 (CYANOCOBALAMIN) 1000  MCG tablet Take 1,000 mcg by mouth daily.    ? famotidine (PEPCID) 20 MG tablet Take 20 mg by mouth daily as needed for heartburn or indigestion. Pepcid complete at bedtime.    ? ? ?No results found for this or any previous visit (from the past 48 hour(s)). ?No results found. ? ?Review of Systems ? ?Blood pressure 96/60, temperature 98.7 ?F (37.1 ?C), temperature source Oral, resp. rate 11, height '5\' 3"'$  (1.6 m), weight 68.5 kg, SpO2 99 %. ?Physical Exam ?HENT:  ?   Mouth/Throat:  ?   Mouth: Mucous membranes are moist.  ?   Pharynx: Oropharynx is clear.  ?Eyes:  ?   General: No scleral icterus. ?   Conjunctiva/sclera: Conjunctivae normal.  ?Cardiovascular:  ?   Rate and Rhythm: Normal rate and regular rhythm.  ?   Heart sounds: Normal heart sounds. No murmur heard. ?Pulmonary:  ?   Effort: Pulmonary effort is normal.  ?   Breath sounds: Normal breath sounds.  ?Abdominal:  ?   General: There is no distension.  ?   Palpations: Abdomen is soft. There is no mass.  ?   Tenderness: There is no abdominal tenderness.  ?Musculoskeletal:  ?   Cervical back: Neck supple.  ?Lymphadenopathy:  ?   Cervical: No cervical adenopathy.  ?Neurological:  ?   Mental Status: She is alert.  ?  ? ?Assessment/Plan ? ?Poorly controlled GERD symptoms despite therapy. ?Diagnostic esophagogastroduodenoscopy. ? ?Hildred Laser, MD ?02/12/2022, 1:47 PM ? ? ? ?

## 2022-02-12 NOTE — Anesthesia Preprocedure Evaluation (Addendum)
Anesthesia Evaluation  ?Patient identified by MRN, date of birth, ID band ?Patient awake ? ? ? ?Reviewed: ?Allergy & Precautions, NPO status , Patient's Chart, lab work & pertinent test results ? ?Airway ?Mallampati: II ? ?TM Distance: >3 FB ?Neck ROM: Full ? ? ? Dental ? ?(+) Dental Advisory Given ?Bridge and crowns:   ?Pulmonary ?neg pneumonia ,  ?  ?Pulmonary exam normal ?breath sounds clear to auscultation ? ? ? ? ? ? Cardiovascular ?negative cardio ROS ?Normal cardiovascular exam ?Rhythm:Regular Rate:Normal ? ? ?  ?Neuro/Psych ?Anxiety negative neurological ROS ? negative psych ROS  ? GI/Hepatic ?Neg liver ROS, GERD  Medicated and Poorly Controlled,  ?Endo/Other  ?negative endocrine ROS ? Renal/GU ?negative Renal ROS  ?negative genitourinary ?  ?Musculoskeletal ?negative musculoskeletal ROS ?(+)  ? Abdominal ?  ?Peds ?negative pediatric ROS ?(+)  Hematology ?negative hematology ROS ?(+)   ?Anesthesia Other Findings ?H/o Covid infection - mostly GI symptoms and SOB on exertion ? Reproductive/Obstetrics ?negative OB ROS ? ?  ? ? ? ? ? ? ? ? ? ? ? ? ? ?  ?  ? ? ? ? ? ? ?Anesthesia Physical ?Anesthesia Plan ? ?ASA: 2 ? ?Anesthesia Plan: General  ? ?Post-op Pain Management: Minimal or no pain anticipated  ? ?Induction: Intravenous ? ?PONV Risk Score and Plan: Propofol infusion ? ?Airway Management Planned: Nasal Cannula and Natural Airway ? ?Additional Equipment:  ? ?Intra-op Plan:  ? ?Post-operative Plan:  ? ?Informed Consent: I have reviewed the patients History and Physical, chart, labs and discussed the procedure including the risks, benefits and alternatives for the proposed anesthesia with the patient or authorized representative who has indicated his/her understanding and acceptance.  ? ? ? ?Dental advisory given ? ?Plan Discussed with: CRNA and Surgeon ? ?Anesthesia Plan Comments:   ? ? ? ? ? ? ?Anesthesia Quick Evaluation ? ?

## 2022-02-12 NOTE — Discharge Instructions (Signed)
No aspirin or NSAIDs for 24 hours ?Resume usual medications as before. ?Resume usual diet. ?No driving for 24 hours. ?Physician will call with biopsy results and further recommendations. ?

## 2022-02-12 NOTE — Anesthesia Postprocedure Evaluation (Signed)
Anesthesia Post Note ? ?Patient: Toni Wolf ? ?Procedure(s) Performed: ESOPHAGOGASTRODUODENOSCOPY (EGD) WITH PROPOFOL ?BIOPSY ? ?Patient location during evaluation: Endoscopy ?Anesthesia Type: General ?Level of consciousness: awake and alert and oriented ?Pain management: pain level controlled ?Vital Signs Assessment: post-procedure vital signs reviewed and stable ?Respiratory status: spontaneous breathing, nonlabored ventilation and respiratory function stable ?Cardiovascular status: blood pressure returned to baseline and stable ?Postop Assessment: no apparent nausea or vomiting ?Anesthetic complications: no ? ? ?No notable events documented. ? ? ?Last Vitals:  ?Vitals:  ? 02/12/22 1136 02/12/22 1415  ?BP: 96/60 101/61  ?Pulse:  77  ?Resp: 11 15  ?Temp: 37.1 ?C 36.5 ?C  ?SpO2: 99% 98%  ?  ?Last Pain:  ?Vitals:  ? 02/12/22 1415  ?TempSrc: Oral  ?PainSc: 0-No pain  ? ? ?  ?  ?  ?  ?  ?  ? ?Kiley Solimine C Jaisen Wiltrout ? ? ? ? ?

## 2022-02-12 NOTE — Op Note (Signed)
[facility] ?Patient Name: Toni Wolf ?Procedure Date: 02/12/2022 1:29 PM ?MRN: [id number] ?Date of Birth: 01-08-1966 ?Attending MD: Hildred Laser , MD ?CSN: [id number] ?Age: 56 ?Admit Type: Outpatient ?Procedure:                Upper GI endoscopy ?Indications:              Gastro-esophageal reflux disease, Follow-up of  ?                          gastro-esophageal reflux disease ?Providers:                Hildred Laser, MD, Lambert Mody, [place]                          Risa Grill, Technician ?Referring MD:             Gar Ponto, MD ?Medicines:                Propofol per Anesthesia ?Complications:            No immediate complications. ?Estimated Blood Loss:     Estimated blood loss was minimal. ?Procedure:                Pre-Anesthesia Assessment: ?                          - Prior to the procedure, a History and Physical  ?                          was performed, and patient medications and  ?                          allergies were reviewed. The patient's tolerance of  ?                          previous anesthesia was also reviewed. The risks  ?                          and benefits of the procedure and the sedation  ?                          options and risks were discussed with the patient.  ?                          All questions were answered, and informed consent  ?                          was obtained. Prior Anticoagulants: The patient has  ?                          taken no previous anticoagulant or antiplatelet  ?                          agents. ASA Grade Assessment: II - A patient with  ?  mild systemic disease. After reviewing the risks  ?                          and benefits, the patient was deemed in  ?                          satisfactory condition to undergo the procedure. ?                          After obtaining informed consent, the endoscope was  ?                          passed under direct vision. Throughout the  ?                           procedure, the patient's blood pressure, pulse, and  ?                          oxygen saturations were monitored continuously. The  ?                          GIF-H190 ([id number]) scope was introduced through the  ?                          mouth, and advanced to the second part of duodenum.  ?                          The upper GI endoscopy was accomplished without  ?                          difficulty. The patient tolerated the procedure  ?                          well. ?Scope In: 2:00:35 PM ?Scope Out: 2:10:31 PM ?Total Procedure Duration: 0 hours 9 minutes 56 seconds  ?Findings: ?     The hypopharynx was normal. ?     The examined esophagus was normal. ?     The Z-line was irregular and was found 33 cm from the incisors. This was  ?     biopsied with a cold forceps for histology. The pathology specimen was  ?     placed into Bottle Number 2. ?     A 3 cm hiatal hernia was present. ?     Patchy mild inflammation characterized by congestion (edema) and  ?     erythema was found in the gastric antrum and in the prepyloric region of  ?     the stomach. Biopsies were taken with a cold forceps for histology. The  ?     pathology specimen was placed into Bottle Number 1. ?     The exam of the stomach was otherwise normal. ?     The duodenal bulb and second portion of the duodenum were normal. ?Impression:               - Normal hypopharynx. ?                          -  Normal esophagus. ?                          - Z-line irregular, 33 cm from the incisors.  ?                          Biopsied. ?                          - 3 cm hiatal hernia. ?                          - Gastritis. Biopsied. ?                          - Normal duodenal bulb and second portion of the  ?                          duodenum. ?Moderate Sedation: ?     Per Anesthesia Care ?Recommendation:           - Patient has a contact number available for  ?                          emergencies. The signs and symptoms of potential  ?                           delayed complications were discussed with the  ?                          patient. Return to normal activities tomorrow.  ?                          Written discharge instructions were provided to the  ?                          patient. ?                          - Resume previous diet today. ?                          - Continue present medications. ?                          - No aspirin, ibuprofen, naproxen, or other  ?                          non-steroidal anti-inflammatory drugs for 1 day. ?                          - Await pathology results. ?Procedure Code(s):        --- Professional --- ?                          [phone], Esophagogastroduodenoscopy, flexible,  ?  transoral; with biopsy, single or multiple ?Diagnosis Code(s):        --- Professional --- ?                          K22.8, Other specified diseases of esophagus ?                          K44.9, Diaphragmatic hernia without obstruction or  ?                          gangrene ?                          K29.70, Gastritis, unspecified, without bleeding ?                          K21.9, Gastro-esophageal reflux disease without  ?                          esophagitis ?CPT copyright 2019 [employer]. All rights reserved. ?The codes documented in this report are preliminary and upon coder review may  ?be revised to meet current compliance requirements. ?Hildred Laser, MD ?Hildred Laser, MD ?02/12/2022 2:19:52 PM ?This report has been signed electronically. ?Number of Addenda: 0 ?

## 2022-02-16 LAB — SURGICAL PATHOLOGY

## 2022-02-17 ENCOUNTER — Other Ambulatory Visit (INDEPENDENT_AMBULATORY_CARE_PROVIDER_SITE_OTHER): Payer: Self-pay | Admitting: Internal Medicine

## 2022-02-17 MED ORDER — ONDANSETRON HCL 4 MG PO TABS: 4.0000 mg | ORAL_TABLET | Freq: Three times a day (TID) | ORAL | 1 refills | Status: DC | PRN

## 2022-02-17 MED ORDER — DEXLANSOPRAZOLE 60 MG PO CPDR: 60.0000 mg | DELAYED_RELEASE_CAPSULE | ORAL | 5 refills | Status: DC

## 2022-02-18 ENCOUNTER — Encounter (HOSPITAL_COMMUNITY): Payer: Self-pay | Admitting: Internal Medicine

## 2022-02-18 NOTE — Telephone Encounter (Signed)
Last ov 01/13/22. Pt had endoscopy 4/6 and med changed on 4/11 from protonix.  ?

## 2022-02-18 NOTE — Telephone Encounter (Signed)
Talked with patient and she states she has tried all the alternatives that are covered. She then said she is not sure if she tried aciphex but thinks she has.  ?

## 2022-02-19 NOTE — Telephone Encounter (Signed)
I talked with patient yesterday and she is aware I will discuss with Dr. Laural Golden this afternoon when he comes in.  ?

## 2022-02-20 ENCOUNTER — Other Ambulatory Visit (INDEPENDENT_AMBULATORY_CARE_PROVIDER_SITE_OTHER): Payer: Self-pay | Admitting: Internal Medicine

## 2022-02-20 ENCOUNTER — Encounter (INDEPENDENT_AMBULATORY_CARE_PROVIDER_SITE_OTHER): Payer: Self-pay | Admitting: *Deleted

## 2022-02-20 ENCOUNTER — Other Ambulatory Visit (INDEPENDENT_AMBULATORY_CARE_PROVIDER_SITE_OTHER): Payer: Self-pay | Admitting: *Deleted

## 2022-02-20 MED ORDER — RABEPRAZOLE SODIUM 20 MG PO TBEC: 20.0000 mg | DELAYED_RELEASE_TABLET | Freq: Two times a day (BID) | ORAL | 0 refills | Status: DC

## 2022-02-25 ENCOUNTER — Other Ambulatory Visit (INDEPENDENT_AMBULATORY_CARE_PROVIDER_SITE_OTHER): Payer: Self-pay | Admitting: Internal Medicine

## 2022-02-25 ENCOUNTER — Other Ambulatory Visit (INDEPENDENT_AMBULATORY_CARE_PROVIDER_SITE_OTHER): Payer: Self-pay | Admitting: *Deleted

## 2022-02-25 MED ORDER — OMEPRAZOLE-SODIUM BICARBONATE 40-1100 MG PO CAPS: 1.0000 | ORAL_CAPSULE | Freq: Every day | ORAL | 2 refills | Status: DC

## 2022-02-25 MED ORDER — FAMOTIDINE 40 MG PO TABS: ORAL_TABLET | ORAL | 2 refills | Status: DC

## 2022-02-25 NOTE — Telephone Encounter (Signed)
Per Dr. Laural Golden - take zegrid '40mg'$  one qam and famotidine '40mg'$  in the evenings. Meds sent to pharm. ?

## 2022-02-25 NOTE — Telephone Encounter (Signed)
Patient called and notified. She verbalized understanding.  ?

## 2022-02-26 NOTE — Telephone Encounter (Signed)
Will discuss with dr Laural Golden this afternoon ?

## 2022-02-27 ENCOUNTER — Other Ambulatory Visit (HOSPITAL_COMMUNITY): Payer: Self-pay | Admitting: Adult Health

## 2022-02-27 ENCOUNTER — Telehealth (INDEPENDENT_AMBULATORY_CARE_PROVIDER_SITE_OTHER): Payer: Self-pay | Admitting: *Deleted

## 2022-02-27 ENCOUNTER — Encounter (INDEPENDENT_AMBULATORY_CARE_PROVIDER_SITE_OTHER): Payer: Self-pay | Admitting: *Deleted

## 2022-02-27 DIAGNOSIS — Z1231 Encounter for screening mammogram for malignant neoplasm of breast: Secondary | ICD-10-CM

## 2022-02-27 NOTE — Telephone Encounter (Signed)
Changed to zegerid otc '20mg'$  one bid and take famotidine '40mg'$  qhs. Tried to call and voicemail is full.  ?

## 2022-02-27 NOTE — Telephone Encounter (Signed)
Discussed with patient and she verbalized understanding.  

## 2022-02-27 NOTE — Telephone Encounter (Signed)
Pt taking pantoprazole bid and pharmacy is requesting change due to insurance not covering. Pharmacy suggested lansoprazole, esomeprazole, omeprazole, aciphex. Patient has tried and failed them all except for aciphex she was not sure about. Dr Laural Golden changed to aciphex and it was not covered. Cost $1100. Per dr Laural Golden take otc zegerid '20mg'$  one bid and famotidine '40mg'$  qhs.  ?

## 2022-04-14 ENCOUNTER — Encounter (INDEPENDENT_AMBULATORY_CARE_PROVIDER_SITE_OTHER): Payer: Self-pay

## 2022-04-16 ENCOUNTER — Encounter (INDEPENDENT_AMBULATORY_CARE_PROVIDER_SITE_OTHER): Payer: Self-pay | Admitting: Gastroenterology

## 2022-04-16 ENCOUNTER — Ambulatory Visit (INDEPENDENT_AMBULATORY_CARE_PROVIDER_SITE_OTHER): Payer: 59 | Admitting: Gastroenterology

## 2022-04-16 VITALS — BP 91/62 | HR 62 | Temp 98.8°F | Ht 63.0 in | Wt 155.8 lb

## 2022-04-16 DIAGNOSIS — R1013 Epigastric pain: Secondary | ICD-10-CM

## 2022-04-16 DIAGNOSIS — K449 Diaphragmatic hernia without obstruction or gangrene: Secondary | ICD-10-CM | POA: Diagnosis not present

## 2022-04-16 DIAGNOSIS — K219 Gastro-esophageal reflux disease without esophagitis: Secondary | ICD-10-CM | POA: Diagnosis not present

## 2022-04-16 DIAGNOSIS — R1310 Dysphagia, unspecified: Secondary | ICD-10-CM

## 2022-04-16 NOTE — Progress Notes (Signed)
Referring Provider: Caryl Bis, MD Primary Care Physician:  Caryl Bis, MD Primary GI Physician: Laural Golden   Chief Complaint  Patient presents with   Hiatal Hernia    Follow up on hiatal hernia. Having dryness in throat and pain in center of chest, nausea, stomach pain.    HPI:   Toni Wolf is a 56 y.o. female with past medical history of axiety, ASCUS, GERD, hiatal hernia  Patient presenting today for follow up of GERD/hiatal hernia  History: Patient last seen in March 2023, at that time, still having some issues with heartburn and sometimes discomfort in her chest area when she swallows, notably worse with certain foods such as tomato based or coffee. reported waking up with a foul taste in her mouth and a raw feeling in her throat, noting she felt acid is still coming up in her throat some at night. She denied dysphagia. took the carafate for a few weeks and did not really notice any improvement, she also had some itching that began after starting the medication, for which she reached out to our office and was advised to stop the carafate in case the itching was an adverse reaction to the medication (later determined itching was related to eczema).  Was using gas-x which provided  some relief.was taking pepcid at bedtime which also has helped some. Was taking some stool softeners at bedtime for chronic constipation which seems to work well for her. Patient was continued on protonix BID, pepcid QHS, advised to try daily probiotic and scheduled for EGD with findings as below.  Present:  After EGD, patient was started on Zegerid '20mg'$  BID with famotidine '40mg'$  QHS, as protonix became uncovered by her insurance and she had failed other previous PPIs that her insurance would cover.  Today, Patient reports that she is having some issues still with epigastric pain, acid regurgitation at night, dry throat and cough. She notes some "gurgling" at times in her throat which she considers is  probably acid coming back up. She does feel that symptoms have been better though recently she ate some cabbage and vinegar which seemed to really flare her symptoms. She is currently doing zegerid BID and pepcid QHS. She will take an extra pepcid [facility] on occasion if symptoms are flaring. She states she has decreased her coffee intake some to help. She feels that symptoms are occurring less frequently, maybe not even weekly at this point, though She does feel that most of her symptoms are related to a lot of things she is eating though she is trying to pinpoint and avoid certain triggers. She has not tried elevating the head of her bed. She notes she has had more stress recently which she feels has influenced it. She inquired about possibly adding carafate back in at bedtime as she determined after using it previously that itching was from eczema not the medication. Occasionally feels that she has some discomfort in her throat with swallowing but no episodes of food impaction or pills becoming stuck.   She is using miralax PRN and colace 2 QHS. She states that she feels this regimen is working well. Denies rectal bleeding or melena.   Last Colonoscopy:03/13/21 One 3 mm polyp in the descending colon-tubular adenoma - One 6 mm polyp in the sigmoid colon-tubular adenoma - External hemorrhoids. - Anal papilla(e) were hypertrophied Last Endoscopy:- 02/12/22 Normal hypopharynx. - Normal esophagus. - Z-line irregular, 33 cm from the incisors. Biopsied. - 3 cm hiatal hernia. - Gastritis.  Biopsied. - Normal duodenal bulb and second portion of the duodenum.   Past Medical History:  Diagnosis Date   Anxiety    ASCUS of cervix with negative high risk HPV 04/23/2021   04/23/21 repeat pap in 3 years per [facility]   GERD (gastroesophageal reflux disease)    Seasonal allergies     Past Surgical History:  Procedure Laterality Date   BIOPSY  03/13/2021   Procedure: BIOPSY;  Surgeon: Rogene Houston, MD;  Location: [facility]  ENDO SU[facility];  Service: Endoscopy;;  descending colon polyp   BIOPSY  02/12/2022   Procedure: BIOPSY;  Surgeon: Rogene Houston, MD;  Location: [facility] ENDO [facility];  Service: Endoscopy;;   CHOLECYSTECTOMY     COLONOSCOPY N/A 01/23/2016   Procedure: COLONOSCOPY;  Surgeon: Rogene Houston, MD;  Location: [facility] ENDO [facility];  Service: Endoscopy;  Laterality: N/A;  830   COLONOSCOPY N/A 03/13/2021   Rehman: 47m tubular adenoma in descending colon, 660mtubular adenoma in sigmoid colon, external hemorrhoids, anal papillae hypertrophied   ESOPHAGOGASTRODUODENOSCOPY (EGD) WITH PROPOFOL N/A 02/12/2022   Procedure: ESOPHAGOGASTRODUODENOSCOPY (EGD) WITH PROPOFOL;  Surgeon: ReRogene HoustonMD;  Location[facility];  Service: Endoscopy;  Laterality: N/A;  115   LAPAROSCOPY     NASAL SEPTUM SURGERY     POLYPECTOMY  03/13/2021   Procedure: POLYPECTOMY;  Surgeon: ReRogene HoustonMD;  Location: [facility];  Service: Endoscopy;;  sigmoid colon polyp    Current Outpatient Medications  Medication Sig Dispense Refill   Calcium Carbonate-Vitamin D (CALCIUM 600+D PO) Take 1 tablet by mouth daily.     CRANBERRY PO Take by mouth. Azo cranberry 2 at bedtime     docusate sodium (COLACE) 100 MG capsule Take 100 mg by mouth at bedtime.     escitalopram (LEXAPRO) 10 MG tablet Take 5 mg by mouth at bedtime.     estradiol (VIVELLE-DOT) 0.0375 MG/24HR Place 1 patch onto the skin 2 (two) times a week. 8 patch 12   famotidine (PEPCID) 40 MG tablet Take one tablet in the evenings 30 tablet 2   fluticasone (FLONASE) 50 MCG/ACT nasal spray Place 1 spray into both nostrils 2 (two) times daily as needed for allergies or rhinitis.     hydrocortisone (ANUSOL-HC) 25 MG suppository Place 1 suppository (25 mg total) rectally 2 (two) times daily as needed for hemorrhoids or anal itching. 12 suppository 6   levocetirizine (XYZAL) 5 MG tablet Take 5 mg by mouth every morning.     Multiple Vitamins-Minerals (MULTIVITAMIN WITH MINERALS) tablet  Take 1 tablet by mouth daily.     Omeprazole-Sodium Bicarbonate (ZEGERID OTC PO) Take by mouth. '20mg'$  one bid - OTC     ondansetron (ZOFRAN) 4 MG tablet Take 1 tablet (4 mg total) by mouth every 8 (eight) hours as needed for nausea or vomiting. 30 tablet 1   polyethylene glycol (MIRALAX / GLYCOLAX) 17 g packet Take 17 g by mouth daily as needed for mild constipation.     PROCTOSOL HC 2.5 % rectal cream Place 1 application. rectally 2 (two) times daily as needed for hemorrhoids.     progesterone (PROMETRIUM) 100 MG capsule Take 1 capsule (100 mg total) by mouth at bedtime. 90 capsule 3   Propylene Glycol (SYSTANE COMPLETE) 0.6 % SOLN Place 1 drop into both eyes in the morning and at bedtime.     sucralfate (CARAFATE) 1 g tablet Take 1 g by mouth. 2 qhs prn     triamcinolone cream (KENALOG)  0.1 % Apply 1 application. topically 3 (three) times daily as needed for rash.     vitamin B-12 (CYANOCOBALAMIN) 1000 MCG tablet Take 1,000 mcg by mouth daily.     No current facility-administered medications for this visit.    Allergies as of 04/16/2022 - Review Complete 04/16/2022  Allergen Reaction Noted   Latex Rash 04/17/2021    Family History  Problem Relation Age of Onset   Heart disease Paternal Grandfather    Diabetes Paternal Grandfather    Heart disease Maternal Grandmother    Diabetes Maternal Grandmother    Diabetes Father    Heart disease Father    COPD Father    Crohn's disease Mother    Cirrhosis Mother    Arthritis Sister    Colon cancer Neg Hx     Social History   Socioeconomic History   Marital status: Married    Spouse name: Not on file   Number of children: Not on file   Years of education: Not on file   Highest education level: Not on file  Occupational History   Not on file  Tobacco Use   Smoking status: Never    Passive exposure: Never   Smokeless tobacco: Never  Vaping Use   Vaping Use: Never used  Substance and Sexual Activity   Alcohol use: Not Currently    Drug use: No   Sexual activity: Not Currently    Birth control/protection: Post-menopausal  Other Topics Concern   Not on file  Social History Narrative   Not on file   Social Determinants of Health   Financial Resource Strain: Low Risk  (04/17/2021)   Overall Financial Resource Strain (CARDIA)    Difficulty of Paying Living Expenses: Not hard at all  Food Insecurity: No Food Insecurity (04/17/2021)   Hunger Vital Sign    Worried About Running Out of Food in the Last Year: Never true    Ran Out of Food in the Last Year: Never true  Transportation Needs: No Transportation Needs (04/17/2021)   PRAPARE - Hydrologist (Medical): No    Lack of Transportation (Non-Medical): No  Physical Activity: Insufficiently Active (04/17/2021)   Exercise Vital Sign    Days of Exercise per Week: 3 days    Minutes of Exercise per Session: 40 min  Stress: No Stress Concern Present (04/17/2021)   [place]    Feeling of Stress : Not at all  Social Connections: [place] (04/17/2021)   Social Connection and Isolation Panel [NHANES]    Frequency of Communication with Friends and Family: More than three times a week    Frequency of Social Gatherings with Friends and Family: Once a week    Attends Religious Services: More than 4 times per year    Active Member of [employer] or Organizations: Yes    Attends Music therapist: More than 4 times per year    Marital Status: Married   Review of systems General: negative for malaise, night sweats, fever, chills, weight loss Neck: Negative for lumps, goiter, pain and significant neck swelling Resp: Negative for cough, wheezing, dyspnea at rest CV: Negative for chest pain, leg swelling, palpitations, orthopnea GI: denies melena, hematochezia, nausea, vomiting, diarrhea, constipation, dysphagia, early satiety or unintentional weight loss. +odynophagia +acid  regurgitation +epigastric pain  MSK: Negative for joint pain or swelling, back pain, and muscle pain. Derm: Negative for itching or rash Psych: Denies depression, anxiety, memory  loss, confusion. No homicidal or suicidal ideation.  Heme: Negative for prolonged bleeding, bruising easily, and swollen nodes. Endocrine: Negative for cold or heat intolerance, polyuria, polydipsia and goiter. Neuro: negative for tremor, gait imbalance, syncope and seizures. The remainder of the review of systems is noncontributory.  Physical Exam: BP 91/62 (BP Location: Left Arm, Patient Position: Sitting, Cuff Size: Large)   Pulse 62   Temp 98.8 F (37.1 C) (Oral)   Ht '5\' 3"'$  (1.6 m)   Wt 155 lb 12.8 oz (70.7 kg)   BMI 27.60 kg/m  General:   Alert and oriented. No distress noted. Pleasant and cooperative.  Head:  Normocephalic and atraumatic. Eyes:  Conjuctiva clear without scleral icterus. Mouth:  Oral mucosa pink and moist. Good dentition. No lesions. Heart: Normal rate and rhythm, s1 and s2 heart sounds present.  Lungs: Clear lung sounds in all lobes. Respirations equal and unlabored. Abdomen:  +BS, soft, non-tender and non-distended. No rebound or guarding. No HSM or masses noted. Derm: No palmar erythema or jaundice Msk:  Symmetrical without gross deformities. Normal posture. Extremities:  Without edema. Neurologic:  Alert and  oriented x4 Psych:  Alert and cooperative. Normal mood and affect.  Invalid input(s): "6 MONTHS"   ASSESSMENT: AMY GOTHARD is a 56 y.o. female presenting today for follow up of GERD and hiatal hernia.  Symptoms seem to have improved some on zegerid BID with Pepcid '40mg'$  QHS, though she is still having some breakthrough acid regurgitation, some sore throat, cough and epigastric pain with certain foods. Given that her symptoms have been refractory to treatment and she is requiring multiple medical therapies to help keep symptoms at [place], I did discuss the TIF procedure with  the patient, while she is not a candidate for this to be done here with Dr. Jenetta Downer as her hiatal hernia is 3cm, I would consider she may be a candidate to have this done with Dr. Bryan Lemma at [place]. Patient is amenable to have further evaluation for appropriateness of undergoing TIF procedure. I will reach out to Dr. Bryan Lemma to have him review her chart for possibility of further formal evaluation for the TIF procedure. In the meantime, we will continue zegerid BID, pepcid QHS and she can try carafate 1g before foods that seem to cause more symptoms and at bedtime. I encourage her to be very mindful of trigger foods, make sure she is staying upright 2-3 hours after eating, avoiding heavier meals later in the day and elevating the head of her bed. She is aware that recent increase in stress has probably also contributed some to her symptoms.   Constipation is well managed with miralax PRN and colace QHS.    PLAN:  Continue Zegerid BID 2. Continue Pepcid '40mg'$  QHS  3. Carafate 1g before trigger foods and at bedtime 4. Referral to [place] for possible TIF procedure 5. Elevate HOB 6. Continue with reflux precautions 7. Stress management  8. Avoid NSAIDs  All questions were answered, patient verbalized understanding and is in agreement with plan as outlined above.   Follow Up: 6 month-virtual okay  Jordani Nunn L. Alver Sorrow, MSN, APRN, AGNP-C Adult-Gerontology Nurse Practitioner [facility]

## 2022-04-16 NOTE — Patient Instructions (Signed)
It was nice to see you today! I'm glad that symptoms have improved some, let's keep doing the zegerid twice a day, pepcid in the evenings and you can try adding carafate 1g before foods that you know are triggers for you and at bedtime Try elevating the head of your bed as this can also help to prevent acid regurgitation during sleep  Be mindful of greasy, spicy, fried, tomato based, citrus foods, caffeine, carbonated drinks, chocolate and alcohol  Make sure you are Staying upright 2-3 hours after eating, prior to lying down and avoid eating late in the evenings.  I will send a message to Dr. Bryan Lemma at [place], he is the provider who does TIF procedure there to see if he feels you are a good candidate for this, if so we will send a referral over to have them set up an evaluation of this for you. We will still remain your primary GI so please reach out if you have any questions or concerns  Follow up 6 months (can be virtual if needed)

## 2022-04-21 ENCOUNTER — Telehealth: Payer: Self-pay

## 2022-04-21 ENCOUNTER — Encounter: Payer: Self-pay | Admitting: Adult Health

## 2022-04-21 ENCOUNTER — Ambulatory Visit (INDEPENDENT_AMBULATORY_CARE_PROVIDER_SITE_OTHER): Payer: 59 | Admitting: Adult Health

## 2022-04-21 VITALS — BP 124/72 | HR 56 | Ht 62.5 in | Wt 156.0 lb

## 2022-04-21 DIAGNOSIS — Z01419 Encounter for gynecological examination (general) (routine) without abnormal findings: Secondary | ICD-10-CM | POA: Insufficient documentation

## 2022-04-21 DIAGNOSIS — Z1211 Encounter for screening for malignant neoplasm of colon: Secondary | ICD-10-CM | POA: Diagnosis not present

## 2022-04-21 DIAGNOSIS — Z79890 Hormone replacement therapy (postmenopausal): Secondary | ICD-10-CM | POA: Diagnosis not present

## 2022-04-21 DIAGNOSIS — N952 Postmenopausal atrophic vaginitis: Secondary | ICD-10-CM | POA: Diagnosis not present

## 2022-04-21 DIAGNOSIS — R87610 Atypical squamous cells of undetermined significance on cytologic smear of cervix (ASC-US): Secondary | ICD-10-CM

## 2022-04-21 LAB — HEMOCCULT GUIAC POC 1CARD (OFFICE): Fecal Occult Blood, POC: NEGATIVE

## 2022-04-21 MED ORDER — ESTRADIOL 0.1 MG/GM VA CREA: TOPICAL_CREAM | VAGINAL | 1 refills | Status: DC

## 2022-04-21 NOTE — Telephone Encounter (Signed)
Spoke with Toni Wolf to schedule appt with Dr. Bryan Lemma. Toni Wolf scheduled 06/17/22 at 8:20 am. Toni Wolf verbalized understanding.

## 2022-04-21 NOTE — Telephone Encounter (Signed)
-----   Message from Coram, DO sent at 04/20/2022  9:45 AM EDT ----- Regarding: FW: TIF procedure See message below.  I sent a reply to [facility] and agree with seeing the patient in consultation.  Can you please set up an appointment with me to discuss TIF.  Thank you.  VC  ----- Message ----- From: Gabriel Rung, NP Sent: 04/16/2022   9:47 AM EDT To: Lavena Bullion, DO Subject: TIF procedure                                  Dr. Bryan Lemma,  I hope this message finds you well. I am the Nurse Practitioner at the office of Dr. Laural Golden and Dr. Jenetta Downer in [place]. I am reaching out regarding the attached patient. She has failed multiple PPIs in the past, is currently on PPI BID with H2B and carafate with continued GERD symptoms, has a hiatal hernia that is 3 cm. I consider that she may be a candidate for the TIF procedure given her symptoms have been refractory to treatment and difficult to manage. She is of relatively good health and BMI is <35. As her hiatal hernia is >2cm, TIF cannot be performed here by Dr. Jenetta Downer. If you are able to review her chart at your earliest convenience and let me know if you feel that she would be candidate for TIF procedure with you, I will place a referral for her to have a formal in person evaluation.  Thank you for your consideration!  Chelsea L. Alver Sorrow, MSN, APRN, AGNP-C Adult-Gerontology Nurse Practitioner [facility]

## 2022-04-21 NOTE — Progress Notes (Signed)
Patient ID: Toni Wolf, female   DOB: [birth year], 56 y.o.   MRN: [id number] History of Present Illness: Toni Wolf is a 56 year old white female,married, PM in for a well woman gyn exam. Lab Results  Component Value Date   DIAGPAP (A) 04/17/2021    - Atypical squamous cells of undetermined significance (ASC-US)   HPVHIGH Negative 04/17/2021   Has some vaginal irritation and is on HRT. Son got married.  Stress at work.  PCP is Dr [name].    Current Medications, Allergies, Past Medical History, Past Surgical History, Family History and Social History were reviewed in [employer] record.     Review of Systems: Patient denies any headaches, hearing loss, fatigue, blurred vision, shortness of breath, chest pain, abdominal pain, problems with bowel movements, urination, or intercourse(not active). No joint pain or mood swings. Denies any vaginal bleeding.  +vaginal irritation    Physical Exam:BP 124/72 (BP Location: Left Arm, Patient Position: Sitting, Cuff Size: Normal)   Pulse (!) 56   Ht 5' 2.5" (1.588 m)   Wt 156 lb (70.8 kg)   BMI 28.08 kg/m   General:  Well developed, well nourished, no acute distress Skin:  Warm and dry Neck:  Midline trachea, normal thyroid, good ROM, no lymphadenopathy Lungs; Clear to auscultation bilaterally Breast:  No dominant palpable mass, retraction, or nipple discharge Cardiovascular: Regular rate and rhythm Abdomen:  Soft, non tender, no hepatosplenomegaly Pelvic:  External genitalia is normal in appearance, no lesions.  The vagina is pale with loss of moisture and rugae, is irritated and red at introitus. Urethra has no lesions or masses. The cervix is smooth.  Uterus is felt to be normal size, shape, and contour.  No adnexal masses or tenderness noted.Bladder is non tender, no masses felt. Rectal: Good sphincter tone, no polyps, + hemorrhoids felt.  Hemoccult negative. Extremities/musculoskeletal:  No swelling or varicosities  noted, no clubbing or cyanosis Psych:  No mood changes, alert and cooperative,seems happy AA is 1 Fall risk is low    04/21/2022    3:46 PM 04/17/2021    2:48 PM  Depression screen PHQ 2/9  Decreased Interest 1 0  Down, Depressed, Hopeless 1 0  PHQ - 2 Score 2 0  Altered sleeping 0 1  Tired, decreased energy 1 1  Change in appetite 0 1  Feeling bad or failure about yourself  0 0  Trouble concentrating 0 0  Moving slowly or fidgety/restless 0 0  Suicidal thoughts 0 0  PHQ-9 Score 3 3       04/21/2022    3:46 PM 04/17/2021    2:48 PM  GAD 7 : Generalized Anxiety Score  Nervous, Anxious, on Edge 1 0  Control/stop worrying 0 1  Worry too much - different things 0 0  Trouble relaxing 0 0  Restless 0 0  Easily annoyed or irritable 1 0  Afraid - awful might happen 0 0  Total GAD 7 Score 2 1      Upstream - 04/21/22 1544       Pregnancy Intention Screening   Does the patient want to become pregnant in the next year? N/A    Does the patient's partner want to become pregnant in the next year? N/A    Would the patient like to discuss contraceptive options today? N/A      Contraception Wrap Up   Current Method No Method - Other Reason   PM   End Method No Method -  Other Reason    Contraception Counseling Provided No             Examination chaperoned by Levy Pupa LPN  Impression and Plan: 1. Encounter for well woman exam with routine gynecological exam Pap in 2025 Physical in 1 year Mammogram yearly Labs with PCP Colonoscopy per GI  2. ASCUS of cervix with negative high risk HPV Pap 2025  3. Encounter for screening fecal occult blood testing Hemoccult negative   4. Vaginal atrophy Will add vaginal estrogen Meds ordered this encounter  Medications   estradiol (ESTRACE) 0.1 MG/GM vaginal cream    Sig: Use 1 gm in vagina at bedtime for 2 weeks then 2-3 x weekly    Dispense:  42.5 g    Refill:  1    Order Specific Question:   Supervising Provider    Answer:    EURE, LUTHER H [[id number]]     5. Hormone replacement therapy (HRT) Continue Vivelle dot 0.0375 mg and 100 mg Prometrium, has refills

## 2022-04-27 ENCOUNTER — Ambulatory Visit (HOSPITAL_COMMUNITY): Payer: 59

## 2022-05-01 ENCOUNTER — Ambulatory Visit (HOSPITAL_COMMUNITY)
Admission: RE | Admit: 2022-05-01 | Discharge: 2022-05-01 | Disposition: A | Discharge status: Home / Self Care | Payer: 59 | Source: Ambulatory Visit | Attending: Adult Health | Admitting: Adult Health

## 2022-05-01 DIAGNOSIS — Z1231 Encounter for screening mammogram for malignant neoplasm of breast: Secondary | ICD-10-CM | POA: Diagnosis not present

## 2022-05-14 ENCOUNTER — Other Ambulatory Visit (INDEPENDENT_AMBULATORY_CARE_PROVIDER_SITE_OTHER): Payer: Self-pay | Admitting: Gastroenterology

## 2022-05-27 ENCOUNTER — Other Ambulatory Visit (INDEPENDENT_AMBULATORY_CARE_PROVIDER_SITE_OTHER): Payer: Self-pay | Admitting: Internal Medicine

## 2022-05-27 NOTE — Telephone Encounter (Signed)
Seen 04/16/22

## 2022-06-09 ENCOUNTER — Encounter (INDEPENDENT_AMBULATORY_CARE_PROVIDER_SITE_OTHER): Payer: Self-pay

## 2022-06-10 ENCOUNTER — Other Ambulatory Visit (INDEPENDENT_AMBULATORY_CARE_PROVIDER_SITE_OTHER): Payer: Self-pay | Admitting: Gastroenterology

## 2022-06-10 DIAGNOSIS — K219 Gastro-esophageal reflux disease without esophagitis: Secondary | ICD-10-CM

## 2022-06-10 MED ORDER — ESOMEPRAZOLE MAGNESIUM 40 MG PO CPDR: 40.0000 mg | DELAYED_RELEASE_CAPSULE | Freq: Two times a day (BID) | ORAL | 1 refills | Status: DC

## 2022-06-11 NOTE — Telephone Encounter (Signed)
Sent through cover my meds. Await decision.

## 2022-06-13 ENCOUNTER — Encounter (INDEPENDENT_AMBULATORY_CARE_PROVIDER_SITE_OTHER): Payer: Self-pay

## 2022-06-15 ENCOUNTER — Telehealth (INDEPENDENT_AMBULATORY_CARE_PROVIDER_SITE_OTHER): Payer: Self-pay | Admitting: *Deleted

## 2022-06-15 NOTE — Telephone Encounter (Signed)
Fax from [facility] - esomeprazole mag cap '40mg'$  dr is approved from 06/12/22 -06/13/23. Letter of approval faxed to [facility]. Tried to call pt to notify and no answer. Voicemail not set up.

## 2022-06-17 ENCOUNTER — Ambulatory Visit: Payer: 59 | Admitting: Gastroenterology

## 2022-07-21 ENCOUNTER — Encounter (INDEPENDENT_AMBULATORY_CARE_PROVIDER_SITE_OTHER): Payer: Self-pay

## 2022-07-27 ENCOUNTER — Encounter (INDEPENDENT_AMBULATORY_CARE_PROVIDER_SITE_OTHER): Payer: Self-pay

## 2022-08-04 DIAGNOSIS — R69 Illness, unspecified: Secondary | ICD-10-CM | POA: Diagnosis not present

## 2022-08-04 DIAGNOSIS — J301 Allergic rhinitis due to pollen: Secondary | ICD-10-CM | POA: Diagnosis not present

## 2022-08-04 DIAGNOSIS — K2100 Gastro-esophageal reflux disease with esophagitis, without bleeding: Secondary | ICD-10-CM | POA: Diagnosis not present

## 2022-08-04 DIAGNOSIS — Z23 Encounter for immunization: Secondary | ICD-10-CM | POA: Diagnosis not present

## 2022-08-04 DIAGNOSIS — L2084 Intrinsic (allergic) eczema: Secondary | ICD-10-CM | POA: Diagnosis not present

## 2022-08-04 DIAGNOSIS — Z6827 Body mass index (BMI) 27.0-27.9, adult: Secondary | ICD-10-CM | POA: Diagnosis not present

## 2022-08-31 ENCOUNTER — Other Ambulatory Visit (INDEPENDENT_AMBULATORY_CARE_PROVIDER_SITE_OTHER): Payer: Self-pay | Admitting: Gastroenterology

## 2022-09-04 ENCOUNTER — Ambulatory Visit: Payer: 59 | Admitting: Gastroenterology

## 2022-09-04 ENCOUNTER — Encounter: Payer: Self-pay | Admitting: Gastroenterology

## 2022-09-04 VITALS — BP 94/70 | HR 60 | Ht 62.25 in | Wt 156.4 lb

## 2022-09-04 DIAGNOSIS — K449 Diaphragmatic hernia without obstruction or gangrene: Secondary | ICD-10-CM | POA: Diagnosis not present

## 2022-09-04 DIAGNOSIS — R110 Nausea: Secondary | ICD-10-CM | POA: Diagnosis not present

## 2022-09-04 DIAGNOSIS — R09A2 Foreign body sensation, throat: Secondary | ICD-10-CM | POA: Diagnosis not present

## 2022-09-04 DIAGNOSIS — K2100 Gastro-esophageal reflux disease with esophagitis, without bleeding: Secondary | ICD-10-CM | POA: Diagnosis not present

## 2022-09-04 NOTE — Progress Notes (Signed)
Chief Complaint:    GERD, hiatal hernia, nausea, discuss antireflux surgical options  Referring Provider: Gabriel Rung, NP ; Dr. Jenetta Downer   HPI:     Patient is a 56 y.o. female referred to me by Scherrie Gerlach, NP and Dr. Jenetta Downer at [facility] for evaluation of possible antireflux intervention with Transoral Incisionless Fundoplication (TIF) with a goal to stop or significantly reduce acid suppression therapy.  Has had reflux for many years with index symptoms as outlined below.  Taking Nexium for many years, but that became less efficacious around 10/2021.  Since then, has had intermittent reflux symptoms along with nausea epigastric/substernal noncardiac chest pain, globus sensation.  Has since trialed multiple different medications to include Carafate (no change), Pepcid, Zegerid, and going back to Nexium.  Insurance did not approve Aciphex, Dexilant.  Currently back to taking Pepcid, Zegerid, and on-demand Carafate as below.  Sleeping with HOB elevated.  GERD history: -Index symptoms: Heartburn, noncardiac chest pain, odynophagia, waterbrash, regurgitation, nocturnal symptoms, rare globus sensation.  No dysphagia -Exacerbating features: Tomato based sauces, spicy, coffee, soda -Medications trialed: Carafate, Pepcid, Protonix, Zegerid, Nexium -Current medications: Pepcid 40 mg qhs, Zegerid OTC bid, Carafate prn -Complications: Hiatal hernia  GERD evaluation: -Last EGD: 02/12/2022 -Barium esophagram: Ordered today -Esophageal Manometry: None -pH/Impedance: None -Bravo: None  Endoscopic History: - EGD (02/12/2022): Normal esophagus, irregular Z-line 33 cm (path: Reflux changes, no Barrett's Esophagus), 3 cm hiatal hernia.  Mild gastritis (path: Mild non-H. pylori gastritis).  Normal duodenum   GERD-HRQL Questionnaire Score: 16/50 (on therapy), marked "dissatisfied" with current health condition     Review of systems:     No chest pain,  no SOB, no fevers, no urinary sx   Past Medical History:  Diagnosis Date   Anxiety    ASCUS of cervix with negative high risk HPV 04/23/2021   04/23/21 repeat pap in 3 years per [facility]   GERD (gastroesophageal reflux disease)    Hiatal hernia    Seasonal allergies     Patient's surgical history, family medical history, social history, medications and allergies were all reviewed in Epic    Current Outpatient Medications  Medication Sig Dispense Refill   Calcium Carbonate-Vitamin D (CALCIUM 600+D PO) Take 1 tablet by mouth daily.     CRANBERRY PO Take by mouth. Azo cranberry 2 at bedtime     docusate sodium (COLACE) 100 MG capsule Take 100 mg by mouth at bedtime. 2 at bedtime     escitalopram (LEXAPRO) 10 MG tablet Take 5 mg by mouth at bedtime.     esomeprazole (NEXIUM) 40 MG capsule Take 1 capsule (40 mg total) by mouth 2 (two) times daily before a meal. 180 capsule 1   estradiol (ESTRACE) 0.1 MG/GM vaginal cream Use 1 gm in vagina at bedtime for 2 weeks then 2-3 x weekly 42.5 g 1   estradiol (VIVELLE-DOT) 0.0375 MG/24HR Place 1 patch onto the skin 2 (two) times a week. 8 patch 12   famotidine (PEPCID) 40 MG tablet TAKE ONE TABLET IN THE EVENINGS 30 tablet 2   fluticasone (FLONASE) 50 MCG/ACT nasal spray Place 1 spray into both nostrils 2 (two) times daily as needed for allergies or rhinitis.     hydrocortisone (ANUSOL-HC) 25 MG suppository Place 1 suppository (25 mg total) rectally 2 (two) times daily as needed for hemorrhoids or anal itching. 12 suppository 6   levocetirizine (XYZAL) 5 MG tablet Take 5 mg by mouth every morning.  Multiple Vitamins-Minerals (MULTIVITAMIN WITH MINERALS) tablet Take 1 tablet by mouth daily.     ondansetron (ZOFRAN) 4 MG tablet Take 1 tablet (4 mg total) by mouth every 8 (eight) hours as needed for nausea or vomiting. 30 tablet 1   polyethylene glycol (MIRALAX / GLYCOLAX) 17 g packet Take 17 g by mouth daily as needed for mild constipation.      PROCTOSOL HC 2.5 % rectal cream Place 1 application. rectally 2 (two) times daily as needed for hemorrhoids.     progesterone (PROMETRIUM) 100 MG capsule Take 1 capsule (100 mg total) by mouth at bedtime. 90 capsule 3   Propylene Glycol (SYSTANE COMPLETE) 0.6 % SOLN Place 1 drop into both eyes in the morning and at bedtime.     sucralfate (CARAFATE) 1 g tablet TAKE 1 TABLET BY MOUTH 4 TIMES A DAY WITH MEALS AND AT BEDTIME 120 tablet 1   triamcinolone cream (KENALOG) 0.1 % Apply 1 application. topically 3 (three) times daily as needed for rash.     vitamin B-12 (CYANOCOBALAMIN) 1000 MCG tablet Take 1,000 mcg by mouth daily.     No current facility-administered medications for this visit.    Physical Exam:     There were no vitals taken for this visit.  GENERAL:  Pleasant female in NAD PSYCH: : Cooperative, normal affect Musculoskeletal:  Normal muscle tone, normal strength NEURO: Alert and oriented x 3, no focal neurologic deficits   IMPRESSION and PLAN:    1) GERD 2) Hiatal hernia 3) Nausea 4) Globus sensation 56 year old female with longstanding history of GERD.  Has clear objective evidence of reflux based on recent endoscopic findings and biopsy-proven reflux changes.  Previously controlled with Nexium, but that became less efficacious over the last year or so.  We discussed the pathophysiology of GERD at length today, to include treatment options of medications vs antireflux surgical options.  Discussed surgical options at length, and plan to proceed as follows:  - Barium esophagram to evaluate esophageal motility, grade/severity of refluxate, and additional radiographic evaluation of hernia - Depending on results of esophagram, briefly discussed the role/utility of Esophageal Manometry +/- pH/impedance testing if needed - We will place referral to Dr. Redmond Pulling for consideration of concomitant laparoscopic hiatal hernia repair and TIF - Discussed the postoperative dietary and  exercise/activity limitations - Continue current medications - Continue antireflux lifestyle/dietary modifications with avoidance of exacerbating foods - Discussed surgical success rates with regards to typical reflux symptoms vs atypical reflux symptoms/LPR.  Typical reflux symptoms such as heartburn, regurgitation tend to do quite well with antireflux surgery whereas there can be variable response with atypical symptoms/LPR   I spent 45 minutes of time, including in depth chart review, independent review of results as outlined above, communicating results with the patient directly, face-to-face time with the patient, coordinating care, ordering studies and medications as appropriate, and documentation.        [place] ,DO, FACG 09/04/2022, 8:25 AM

## 2022-09-04 NOTE — Patient Instructions (Addendum)
You will be contacted by [place] in the next 2 days to arrange an appointment for Barium esophagram. The number on your caller ID will be [phone], please answer when they call.  If you have not heard from them in 2 days please call [phone] to schedule.      You have been scheduled for a Barium Esophogram at [facility] (1st floor of the hospital) on ------------------- at -----------------------------. Please arrive 15 minutes prior to your appointment for registration. Make certain not to have anything to eat or drink 3 hours prior to your test. If you need to reschedule for any reason, please contact radiology at [phone] to do so. __________________________________________________________________ A barium swallow is an examination that concentrates on views of the esophagus. This tends to be a double contrast exam (barium and two liquids which, when combined, create a gas to distend the wall of the oesophagus) or single contrast (non-ionic iodine based). The study is usually tailored to your symptoms so a good history is essential. Attention is paid during the study to the form, structure and configuration of the esophagus, looking for functional disorders (such as aspiration, dysphagia, achalasia, motility and reflux) EXAMINATION You may be asked to change into a gown, depending on the type of swallow being performed. A radiologist and radiographer will perform the procedure. The radiologist will advise you of the type of contrast selected for your procedure and direct you during the exam. You will be asked to stand, sit or lie in several different positions and to hold a small amount of fluid in your mouth before being asked to swallow while the imaging is performed .In some instances you may be asked to swallow barium coated marshmallows to assess the motility of a solid food bolus. The exam can be recorded as a digital or video fluoroscopy  procedure. POST PROCEDURE It will take 1-2 days for the barium to pass through your system. To facilitate this, it is important, unless otherwise directed, to increase your fluids for the next 24-48hrs and to resume your normal diet.  This test typically takes about 30 minutes to perform. __________________________________________________________________________________ - If you would like to view an informative video regarding the TIF procedure, please visit:    [email or web address]  You will be contacted by [facility] to schedule a consult.  [facility] is located at [address].   Phone No# [phone].   If you are age 36 or younger, your body mass index should be between 19-25. Your Body mass index is 28.37 kg/m. If this is out of the aformentioned range listed, please consider follow up with your Primary Care Provider.   __________________________________________________________  The [facility] providers would like to encourage you to use [facility] to communicate with providers for non-urgent requests or questions.  Due to long hold times on the telephone, sending your provider a message by [facility] may be a faster and more efficient way to get a response.  Please allow 48 business hours for a response.  Please remember that this is for non-urgent requests.   Due to recent changes in healthcare laws, you may see the results of your imaging and laboratory studies on MyChart before your provider has had a chance to review them.  We understand that in some cases there may be results that are confusing or concerning to you. Not all laboratory results come back in the same time frame and the provider may be waiting for multiple  results in order to interpret others.  Please give [place] 48 hours in order for your provider to thoroughly review all the results before contacting the office for clarification of your  results.     Thank you for choosing me and [facility].  Vito Cirigliano, D.O.

## 2022-09-08 DIAGNOSIS — Z111 Encounter for screening for respiratory tuberculosis: Secondary | ICD-10-CM | POA: Diagnosis not present

## 2022-09-10 ENCOUNTER — Ambulatory Visit (HOSPITAL_COMMUNITY)
Admission: RE | Admit: 2022-09-10 | Discharge: 2022-09-10 | Disposition: A | Discharge status: Home / Self Care | Payer: 59 | Source: Ambulatory Visit | Attending: Gastroenterology | Admitting: Gastroenterology

## 2022-09-10 DIAGNOSIS — K449 Diaphragmatic hernia without obstruction or gangrene: Secondary | ICD-10-CM | POA: Diagnosis present

## 2022-09-10 DIAGNOSIS — K2100 Gastro-esophageal reflux disease with esophagitis, without bleeding: Secondary | ICD-10-CM | POA: Insufficient documentation

## 2022-09-24 DIAGNOSIS — K449 Diaphragmatic hernia without obstruction or gangrene: Secondary | ICD-10-CM | POA: Diagnosis not present

## 2022-09-24 DIAGNOSIS — K219 Gastro-esophageal reflux disease without esophagitis: Secondary | ICD-10-CM | POA: Diagnosis not present

## 2022-09-27 ENCOUNTER — Encounter (INDEPENDENT_AMBULATORY_CARE_PROVIDER_SITE_OTHER): Payer: Self-pay | Admitting: Gastroenterology

## 2022-10-01 ENCOUNTER — Encounter (INDEPENDENT_AMBULATORY_CARE_PROVIDER_SITE_OTHER): Payer: Self-pay

## 2022-10-05 ENCOUNTER — Encounter (INDEPENDENT_AMBULATORY_CARE_PROVIDER_SITE_OTHER): Payer: Self-pay | Admitting: *Deleted

## 2022-10-05 ENCOUNTER — Encounter (INDEPENDENT_AMBULATORY_CARE_PROVIDER_SITE_OTHER): Payer: Self-pay

## 2022-10-05 NOTE — Telephone Encounter (Signed)
Patient is scheduled for tomorrow with chelsea and she is aware of appt.

## 2022-10-06 ENCOUNTER — Ambulatory Visit (INDEPENDENT_AMBULATORY_CARE_PROVIDER_SITE_OTHER): Payer: 59 | Admitting: Gastroenterology

## 2022-10-06 ENCOUNTER — Encounter (INDEPENDENT_AMBULATORY_CARE_PROVIDER_SITE_OTHER): Payer: Self-pay | Admitting: Gastroenterology

## 2022-10-06 VITALS — BP 100/51 | HR 64 | Temp 98.0°F | Ht 62.25 in | Wt 152.6 lb

## 2022-10-06 DIAGNOSIS — R1013 Epigastric pain: Secondary | ICD-10-CM | POA: Diagnosis not present

## 2022-10-06 DIAGNOSIS — K2100 Gastro-esophageal reflux disease with esophagitis, without bleeding: Secondary | ICD-10-CM

## 2022-10-06 DIAGNOSIS — K449 Diaphragmatic hernia without obstruction or gangrene: Secondary | ICD-10-CM

## 2022-10-06 NOTE — Progress Notes (Addendum)
Referring Provider: Richardean Chimera, MD Primary Care Physician:  Richardean Chimera, MD Primary GI Physician: Levon Hedger   Chief Complaint  Patient presents with   Abdominal Pain    Mid abdominal pain. Nausea after eating pizza one day. Wants to discuss TIF procedure.    HPI:   Toni Wolf is a 56 y.o. female with past medical history of axiety, ASCUS, GERD, hiatal hernia   Patient presenting today for follow up of GERD.  Last seen June 2023, at that time, still having some issues with epigastric pain, acid regurgitation at night, dry throat and cough. She notes some "gurgling" at times in her throat which she considers is probably acid coming back up. currently doing zegerid BID and pepcid QHS. She will take an extra pepcid [facility] on occasion if symptoms are flaring. symptoms are occurring less frequently, maybe not even weekly at this point, though She does feel that most of her symptoms are related to a lot of things she is eating though she is trying to pinpoint and avoid certain triggers. She has not tried elevating the head of her bed. She notes she has had more stress recently which she feels has influenced it. She inquired about possibly adding carafate back in at bedtime. Occasionally feels that she has some discomfort in her throat with swallowing but no episodes of food impaction or pills becoming stuck.   Recommended continue zegerid BID, continue Pepcid 40mg  QHS, Carafate 1g before trigger foods/bedtime, referral to [facility] for TIF procedure.    Appt with Dr. Barron Alvine on 10/27 to discuss TIF, barium esophagram ordered at that time, considerations for esophageal manometry and Ph impedence testing thereafter.   Present:  Patient states that she had appt to discuss TIF with [facility], and became very nervous about proceeding due to concern for complications. Continues to have issues with breakthrough GERD symptoms. She notes that around thanksgiving, she had some pizza, woke up the  next day with severe epigastric discomfort and nausea. This she took mylanta, pepcid and then eventually took a nexium 20mg  with some relief. She has since switched from zegerid back to nexium 20mg  BID. She Is trying to avoid triggers, not drinking any soda or coffee, only water. Trying to stay upright after eating.  She is still noting acid in her throat in the mornings. Esophagram done on 11/2 was normal other than small sliding hh. She notes some continued odynophagia at times. Has lost a few pounds due to trying to eliminate things from her diet. Currently doing nexium 20mg  BID with Pepcid PRN, has not started carafate yet but has plans to pick this up later today and start as previously recommended.   Patient denies melena, hematochezia, vomiting, diarrhea, constipation, dysphagia, early satiety.  Last Colonoscopy:03/13/21 One 3 mm polyp in the descending colon-tubular adenoma - One 6 mm polyp in the sigmoid colon-tubular adenoma - External hemorrhoids. - Anal papilla(e) were hypertrophied Last Endoscopy:- 02/12/22 Normal hypopharynx. - Normal esophagus. - Z-line irregular, 33 cm from the incisors. Biopsied (Gastroesophageal mucosa with mild inflammation consistent with reflux) - 3 cm hiatal hernia. - Gastritis. Biopsied. - Normal duodenal bulb and second portion of the duodenum.   Past Medical History:  Diagnosis Date   Anxiety    ASCUS of cervix with negative high risk HPV 04/23/2021   04/23/21 repeat pap in 3 years per [facility]   GERD (gastroesophageal reflux disease)    Hiatal hernia    Seasonal allergies     Past  Surgical History:  Procedure Laterality Date   BIOPSY  03/13/2021   Procedure: BIOPSY;  Surgeon: Malissa Hippo, MD;  [facility]: [facility];  Service: Endoscopy;;  descending colon polyp   BIOPSY  02/12/2022   Procedure: BIOPSY;  Surgeon: Malissa Hippo, MD;  [facility]: [facility];  Service: Endoscopy;;   CHOLECYSTECTOMY     COLONOSCOPY N/A 01/23/2016   Procedure:  COLONOSCOPY;  Surgeon: Malissa Hippo, MD;  Location: [facility];  Service: Endoscopy;  Laterality: N/A;  830   COLONOSCOPY N/A 03/13/2021   Rehman: 3mm tubular adenoma in descending colon, 6mm tubular adenoma in sigmoid colon, external hemorrhoids, anal papillae hypertrophied   ESOPHAGOGASTRODUODENOSCOPY (EGD) WITH PROPOFOL N/A 02/12/2022   Procedure: ESOPHAGOGASTRODUODENOSCOPY (EGD) WITH PROPOFOL;  Surgeon: Malissa Hippo, MD;  Location: [facility];  Service: Endoscopy;  Laterality: N/A;  115   LAPAROSCOPY     NASAL SEPTUM SURGERY     POLYPECTOMY  03/13/2021   Procedure: POLYPECTOMY;  Surgeon: Malissa Hippo, MD;  [facility]: [facility];  Service: Endoscopy;;  sigmoid colon polyp    Current Outpatient Medications  Medication Sig Dispense Refill   Calcium Carbonate-Vitamin D (CALCIUM 600+D PO) Take 1 tablet by mouth daily.     docusate sodium (COLACE) 100 MG capsule Take 100 mg by mouth at bedtime. 2 at bedtime     escitalopram (LEXAPRO) 10 MG tablet Take 5 mg by mouth at bedtime.     esomeprazole (NEXIUM) 20 MG packet Take 20 mg by mouth 2 (two) times daily.     estradiol (ESTRACE) 0.1 MG/GM vaginal cream Use 1 gm in vagina at bedtime for 2 weeks then 2-3 x weekly 42.5 g 1   estradiol (VIVELLE-DOT) 0.0375 MG/24HR Place 1 patch onto the skin 2 (two) times a week. 8 patch 12   famotidine (PEPCID) 40 MG tablet TAKE ONE TABLET IN THE EVENINGS 30 tablet 2   fluticasone (FLONASE) 50 MCG/ACT nasal spray Place 1 spray into both nostrils 2 (two) times daily as needed for allergies or rhinitis.     loratadine (CLARITIN) 10 MG tablet Take 10 mg by mouth daily.     Multiple Vitamins-Minerals (MULTIVITAMIN WITH MINERALS) tablet Take 1 tablet by mouth daily.     ondansetron (ZOFRAN) 4 MG tablet Take 1 tablet (4 mg total) by mouth every 8 (eight) hours as needed for nausea or vomiting. 30 tablet 1   polyethylene glycol (MIRALAX / GLYCOLAX) 17 g packet Take 17 g by mouth daily as needed for mild  constipation.     PROCTOSOL HC 2.5 % rectal cream Place 1 application. rectally 2 (two) times daily as needed for hemorrhoids.     progesterone (PROMETRIUM) 100 MG capsule Take 1 capsule (100 mg total) by mouth at bedtime. 90 capsule 3   Propylene Glycol (SYSTANE COMPLETE) 0.6 % SOLN Place 1 drop into both eyes in the morning and at bedtime.     sucralfate (CARAFATE) 1 g tablet TAKE 1 TABLET BY MOUTH 4 TIMES A DAY WITH MEALS AND AT BEDTIME 120 tablet 1   triamcinolone cream (KENALOG) 0.1 % Apply 1 application. topically 3 (three) times daily as needed for rash.     vitamin B-12 (CYANOCOBALAMIN) 1000 MCG tablet Take 1,000 mcg by mouth daily.     No current facility-administered medications for this visit.    Allergies as of 10/06/2022 - Review Complete 10/06/2022  Allergen Reaction Noted   Latex Rash 04/17/2021    Family History  Problem Relation Age of Onset   Crohn's disease Mother    Cirrhosis Mother    Diabetes Father    Heart disease Father    COPD Father    Hypertension Father    Arthritis Sister    Heart disease Maternal Grandmother    Diabetes Maternal Grandmother    Arthritis Maternal Grandmother    Heart disease Paternal Grandfather    Diabetes Paternal Grandfather    Colon cancer Neg Hx     Social History   Socioeconomic History   Marital status: Married    Spouse name: Not on file   Number of children: 1   Years of education: Not on file   Highest education level: Not on file  Occupational History   Not on file  Tobacco Use   Smoking status: Never    Passive exposure: Never   Smokeless tobacco: Never  Vaping Use   Vaping Use: Never used  Substance and Sexual Activity   Alcohol use: Yes    Comment: 1 glass of wine  a year   Drug use: No   Sexual activity: Not Currently    Birth control/protection: Post-menopausal  Other Topics Concern   Not on file  Social History Narrative   Not on file   Social Determinants of Health   Financial Resource  Strain: Low Risk  (04/21/2022)   Overall Financial Resource Strain (CARDIA)    Difficulty of Paying Living Expenses: Not hard at all  Food Insecurity: No Food Insecurity (04/21/2022)   Hunger Vital Sign    Worried About Running Out of Food in the Last Year: Never true    Ran Out of Food in the Last Year: Never true  Transportation Needs: No Transportation Needs (04/21/2022)   PRAPARE - Administrator, Civil Service (Medical): No    Lack of Transportation (Non-Medical): No  Physical Activity: Sufficiently Active (04/21/2022)   Exercise Vital Sign    Days of Exercise per Week: 3 days    Minutes of Exercise per Session: 60 min  Stress: No Stress Concern Present (04/21/2022)   [employer] of Occupational Health - Occupational Stress Questionnaire    Feeling of Stress : Only a little  Social Connections: Socially Integrated (04/21/2022)   Social Connection and Isolation Panel [NHANES]    Frequency of Communication with Friends and Family: More than three times a week    Frequency of Social Gatherings with Friends and Family: Three times a week    Attends Religious Services: More than 4 times per year    Active Member of Clubs or Organizations: Yes    Attends Engineer, structural: More than 4 times per year    Marital Status: Married    Review of systems General: negative for malaise, night sweats, fever, chills, weight loss Neck: Negative for lumps, goiter, pain and significant neck swelling Resp: Negative for cough, wheezing, dyspnea at rest CV: Negative for chest pain, leg swelling, palpitations, orthopnea GI: denies melena, hematochezia, vomiting, diarrhea, constipation, dysphagia, odyonophagia, early satiety or unintentional weight loss. +GERD symptoms +epigastric pain +nausea MSK: Negative for joint pain or swelling, back pain, and muscle pain. Derm: Negative for itching or rash Psych: Denies depression, anxiety, memory loss, confusion. No homicidal or suicidal  ideation.  Heme: Negative for prolonged bleeding, bruising easily, and swollen nodes. Endocrine: Negative for cold or heat intolerance, polyuria, polydipsia and goiter. Neuro: negative for tremor, gait imbalance, syncope and seizures. The remainder of the review of systems is noncontributory.  Physical Exam: BP (!) 100/51 (BP Location: Left Arm, Patient Position: Sitting, Cuff Size: Large)   Pulse 64   Temp 98 F (36.7 C) (Oral)   Ht 5' 2.25" (1.581 m)   Wt 152 lb 9.6 oz (69.2 kg)   BMI 27.69 kg/m  General:   Alert and oriented. No distress noted. Pleasant and cooperative.  Head:  Normocephalic and atraumatic. Eyes:  Conjuctiva clear without scleral icterus. Mouth:  Oral mucosa pink and moist. Good dentition. No lesions. Heart: Normal rate and rhythm, s1 and s2 heart sounds present.  Lungs: Clear lung sounds in all lobes. Respirations equal and unlabored. Abdomen:  +BS, soft, non-tender and non-distended. No rebound or guarding. No HSM or masses noted. Derm: No palmar erythema or jaundice Msk:  Symmetrical without gross deformities. Normal posture. Extremities:  Without edema. Neurologic:  Alert and  oriented x4 Psych:  Alert and cooperative. Normal mood and affect.  Invalid input(s): "6 MONTHS"   ASSESSMENT: SHYREL STANG is a 56 y.o. female presenting today for GERD.  GERD has been refractory to PPI/H2B therapy with multiple trials of different PPIs over the course. Recent esophagram normal other than small sliding hiatal hernia. She continues to have breakthrough symptoms with epigastric pain/burning, some odynophagia and feeling of acid in her throat, especially upon waking. I did discuss again with her the TIF procedure which can be done here at [facility] by Dr. Levon Hedger with [facility] repair by Dr. Lovell Sheehan. Given her symptoms have been refractory to medical therapy, I think further discussion regarding risks/benefits of the TIF procedure are warranted. I will have Dr. Levon Hedger reach  out to her for further discussion of TIF. She should not require esophageal manometry as esophagram was normal/no dysphagia and will not need ph impedence testing as EGD earlier this year showed evidence of reflux changes. In the meantime, will continue with nexium 20mg  in the morning, increase to 40mg  in the evening and use carafate QHS and PRN for burning/esophageal/epigastric discomfort. Should continue to practice strict reflux precautions, avoiding triggers and keeping HOB elevated.    PLAN:  Take nexium 20mg  in the morning and 40mg  nexium in the evening  2. Continue strict reflux precautions 3. Take carafate 1g at bedtime, PRN for epigastric pain 4. Discuss TIF/HH repair with Dr. Levon Hedger   All questions were answered, patient verbalized understanding and is in agreement with plan as outlined above.    Follow Up: 3 months   Krue Peterka L. Jeanmarie Hubert, MSN, APRN, AGNP-C Adult-Gerontology Nurse Practitioner [facility]  I have reviewed the note and agree with the APP's assessment as described in this progress note  Will call her to discuss cTIF in detail  Katrinka Blazing, MD Gastroenterology and Hepatology [facility]

## 2022-10-06 NOTE — Patient Instructions (Signed)
Let's do nexium '20mg'$  in the morning and increase to '40mg'$  in the evening, you can also add in the carafate 1g before bedtime and PRN if you are having burning or pain. Continue to keep head of bed elevated and avoid triggers as you are doing. I will have Dr. Jenetta Downer reach out to you regarding further discussion of possible TIF procedure/hiatal hernia repair here.  Follow up 3 months

## 2022-10-07 ENCOUNTER — Ambulatory Visit: Payer: 59 | Admitting: Adult Health

## 2022-10-07 ENCOUNTER — Encounter: Payer: Self-pay | Admitting: Adult Health

## 2022-10-07 VITALS — BP 98/66 | HR 62 | Ht 63.0 in | Wt 152.0 lb

## 2022-10-07 DIAGNOSIS — Z79890 Hormone replacement therapy (postmenopausal): Secondary | ICD-10-CM | POA: Diagnosis not present

## 2022-10-07 DIAGNOSIS — N950 Postmenopausal bleeding: Secondary | ICD-10-CM | POA: Insufficient documentation

## 2022-10-07 NOTE — Progress Notes (Signed)
  Subjective:     Patient ID: Toni Wolf, female   DOB: 1965/11/21, 56 y.o.   MRN: [id number]  HPI Toni Wolf is a 56 year old white female, married, PM, in complaining of vaginal bleeding on HRT, started last Thursday and is stopping now, had cramping and chills and headache, when started.  Last pap was ASCUS negative HPV 04/17/21.  PCP is Dr [name].  Review of Systems +vaginal bleeding on HRT, started last Thursday and is stopping now, had cramping and chills and headache, when started.   Reviewed past medical,surgical, social and family history. Reviewed medications and allergies.  Objective:   Physical Exam BP 98/66 (BP Location: Left Arm, Patient Position: Sitting, Cuff Size: Normal)   Pulse 62   Ht '5\' 3"'$  (1.6 m)   Wt 152 lb (68.9 kg)   BMI 26.93 kg/m   Skin warm and dry.Pelvic: external genitalia is normal in appearance no lesions, vagina: pink,,urethra has no lesions or masses noted, cervix:smooth, uterus: normal size, shape and contour, non tender, no masses felt, adnexa: no masses or tenderness noted. Bladder is non tender and no masses felt.    Fall risk is low  Upstream - 10/07/22 1141       Pregnancy Intention Screening   Does the patient want to become pregnant in the next year? N/A    Does the patient's partner want to become pregnant in the next year? N/A    Would the patient like to discuss contraceptive options today? N/A      Contraception Wrap Up   Current Method No Method - Other Reason   postmenopausal   End Method No Method - Other Reason   postmenopausal   Contraception Counseling Provided No            Examination chaperoned by Levy Pupa LPN  Assessment:     1. PMB (postmenopausal bleeding) +vaginal bleeding for 6 days, had headache, chills and cramping,when started, has all stopped Will get pelvic US 10/12/22 at [facility] at 7:30 am to assess uterine lining  Will talk when [place] results back   2. Hormone replacement therapy (HRT) Continue  Vivelle dot and Prometrium     Plan:     Follow up TBD

## 2022-10-08 ENCOUNTER — Encounter (INDEPENDENT_AMBULATORY_CARE_PROVIDER_SITE_OTHER): Payer: Self-pay | Admitting: *Deleted

## 2022-10-08 ENCOUNTER — Telehealth (INDEPENDENT_AMBULATORY_CARE_PROVIDER_SITE_OTHER): Payer: Self-pay | Admitting: Gastroenterology

## 2022-10-08 NOTE — Addendum Note (Signed)
Addended by: Harvel Quale on: 10/08/2022 07:42 AM   Modules accepted: Level of Service

## 2022-10-08 NOTE — Telephone Encounter (Signed)
Referral placed in epic as Urgent, they will contact patient

## 2022-10-08 NOTE — Telephone Encounter (Signed)
I had an extensive discussion with the patient regarding her ongoing reflux symptoms.  She was previously being seen by Dr. Bryan Lemma and Redmond Pulling for potential combo TIF procedure but the patient decided to have her care locally at [facility].  She has been on chronic PPI with partial response to her symptoms but has presented breakthrough episodes of severe heartburn and chest discomfort despite taking the medications.  Patient had esophagram on 09/10/2022 which only showed small sliding hernia, not complaining of dysphagia. Esophageal biopsies consistent with reflux  The patient and I held a thorough discussion about combo Transoral Incisionless Fundoplication (TIF).  The benefits and risks, as well as prognosis with the use of the different modalities was thoroughly discussed with the patient who understood and agreed.  She would like to discuss the surgical approach with Dr. Arnoldo Morale but is interested in proceeding with this combined procedure.  Ann, can you please refer the patient to Dr. Arnoldo Morale for evaluations ASAP? Dx: hiatal hernia repair and TIF procedure.  Thanks,   Toni Peppers, MD Gastroenterology and Hepatology [facility]

## 2022-10-12 ENCOUNTER — Encounter (INDEPENDENT_AMBULATORY_CARE_PROVIDER_SITE_OTHER): Payer: Self-pay | Admitting: Gastroenterology

## 2022-10-12 ENCOUNTER — Ambulatory Visit (HOSPITAL_COMMUNITY)
Admission: RE | Admit: 2022-10-12 | Discharge: 2022-10-12 | Disposition: A | Discharge status: Home / Self Care | Payer: 59 | Source: Ambulatory Visit | Attending: Adult Health | Admitting: Adult Health

## 2022-10-12 DIAGNOSIS — Z79890 Hormone replacement therapy (postmenopausal): Secondary | ICD-10-CM | POA: Diagnosis not present

## 2022-10-12 DIAGNOSIS — N950 Postmenopausal bleeding: Secondary | ICD-10-CM | POA: Diagnosis not present

## 2022-10-12 NOTE — Telephone Encounter (Signed)
Pt aware that EEC 7 mm will get endometrial biopsy with Dr Nelda Marseille

## 2022-10-14 ENCOUNTER — Encounter (INDEPENDENT_AMBULATORY_CARE_PROVIDER_SITE_OTHER): Payer: Self-pay | Admitting: Gastroenterology

## 2022-10-14 ENCOUNTER — Telehealth: Payer: Self-pay | Admitting: *Deleted

## 2022-10-14 DIAGNOSIS — K449 Diaphragmatic hernia without obstruction or gangrene: Secondary | ICD-10-CM

## 2022-10-14 NOTE — Telephone Encounter (Signed)
Toni Wolf, [name], MD  [name], LPN; Gloriajean Dell S, CMA [name],  Can you please schedule a CT chest and abdomen (no pelvis) with IV contrast? Dx: hiatal hernia surgical evaluation. Please try to have this performed before the end of this year. -------------------------------   PA submitted via evicore. PA pending Case ID #[id number]. Pt insurance also is require [facility] to have done if approved. Clinicals submitted

## 2022-10-19 ENCOUNTER — Telehealth (INDEPENDENT_AMBULATORY_CARE_PROVIDER_SITE_OTHER): Payer: 59 | Admitting: Gastroenterology

## 2022-10-20 NOTE — Telephone Encounter (Signed)
PA approved. Auth# [id number], DOS: 10/15/23-04/13/23

## 2022-10-20 NOTE — Telephone Encounter (Signed)
[facility] is aware and will call pt to schedule

## 2022-10-21 ENCOUNTER — Other Ambulatory Visit: Payer: 59 | Admitting: Obstetrics & Gynecology

## 2022-10-21 DIAGNOSIS — R059 Cough, unspecified: Secondary | ICD-10-CM | POA: Diagnosis not present

## 2022-10-21 DIAGNOSIS — Z6827 Body mass index (BMI) 27.0-27.9, adult: Secondary | ICD-10-CM | POA: Diagnosis not present

## 2022-10-21 DIAGNOSIS — R030 Elevated blood-pressure reading, without diagnosis of hypertension: Secondary | ICD-10-CM | POA: Diagnosis not present

## 2022-10-21 DIAGNOSIS — Z20828 Contact with and (suspected) exposure to other viral communicable diseases: Secondary | ICD-10-CM | POA: Diagnosis not present

## 2022-10-21 DIAGNOSIS — J101 Influenza due to other identified influenza virus with other respiratory manifestations: Secondary | ICD-10-CM | POA: Diagnosis not present

## 2022-10-21 DIAGNOSIS — J210 Acute bronchiolitis due to respiratory syncytial virus: Secondary | ICD-10-CM | POA: Diagnosis not present

## 2022-10-22 ENCOUNTER — Other Ambulatory Visit: Payer: Self-pay | Admitting: Adult Health

## 2022-10-22 MED ORDER — FLUCONAZOLE 150 MG PO TABS
ORAL_TABLET | ORAL | 1 refills | Status: DC
Start: 2022-10-22 — End: 2023-07-05

## 2022-10-22 NOTE — Progress Notes (Signed)
Rx diflucan at her request

## 2022-10-28 ENCOUNTER — Other Ambulatory Visit: Payer: Self-pay | Admitting: Adult Health

## 2022-10-30 ENCOUNTER — Telehealth (INDEPENDENT_AMBULATORY_CARE_PROVIDER_SITE_OTHER): Payer: Self-pay | Admitting: Gastroenterology

## 2022-10-30 ENCOUNTER — Encounter (INDEPENDENT_AMBULATORY_CARE_PROVIDER_SITE_OTHER): Payer: Self-pay | Admitting: Gastroenterology

## 2022-10-30 ENCOUNTER — Other Ambulatory Visit (INDEPENDENT_AMBULATORY_CARE_PROVIDER_SITE_OTHER): Payer: Self-pay | Admitting: Gastroenterology

## 2022-10-30 NOTE — Telephone Encounter (Signed)
Patient reached our office via [place] and stated that she would like to hold off on proceeding with combo TIF.  Procedure will be canceled. Mindy/Darria, please unblock scheduled for 1/25, please schedule any pending procedures for that day or if any patient needs an earlier procedure we can put it there.  Also, there was a time slot held on 1/17 for the TIF training but this will be canceled for now.  Please unblock the standard so we can schedule any other patient starting at 1:45 PM. Also please cancel the authorization process for a CT chest and abdomen I will order in the past.  Lelon Frohlich, please cancel the referral to Dr. Cam Hai office and notify the staff from his office that the patient will not see them.

## 2022-11-04 NOTE — Telephone Encounter (Signed)
Message has been sent to [facility] to cancel apt for patient

## 2022-11-04 NOTE — Telephone Encounter (Signed)
Noted. I have unblocked schedule

## 2022-11-10 ENCOUNTER — Ambulatory Visit: Payer: 59 | Admitting: General Surgery

## 2022-11-17 ENCOUNTER — Ambulatory Visit (INDEPENDENT_AMBULATORY_CARE_PROVIDER_SITE_OTHER): Payer: 59 | Admitting: Obstetrics & Gynecology

## 2022-11-17 ENCOUNTER — Other Ambulatory Visit (HOSPITAL_COMMUNITY)
Admission: RE | Admit: 2022-11-17 | Discharge: 2022-11-17 | Disposition: A | Discharge status: Home / Self Care | Payer: 59 | Source: Ambulatory Visit | Attending: Obstetrics & Gynecology | Admitting: Obstetrics & Gynecology

## 2022-11-17 ENCOUNTER — Encounter: Payer: Self-pay | Admitting: Obstetrics & Gynecology

## 2022-11-17 VITALS — BP 102/68 | HR 73 | Ht 63.0 in | Wt 152.0 lb

## 2022-11-17 DIAGNOSIS — R9389 Abnormal findings on diagnostic imaging of other specified body structures: Secondary | ICD-10-CM | POA: Insufficient documentation

## 2022-11-17 DIAGNOSIS — N950 Postmenopausal bleeding: Secondary | ICD-10-CM

## 2022-11-17 DIAGNOSIS — N858 Other specified noninflammatory disorders of uterus: Secondary | ICD-10-CM | POA: Diagnosis not present

## 2022-11-17 DIAGNOSIS — Z79890 Hormone replacement therapy (postmenopausal): Secondary | ICD-10-CM

## 2022-11-17 NOTE — Progress Notes (Signed)
GYN VISIT Patient name: Toni Wolf MRN [id number]  Date of birth: 04-04-1966 Chief Complaint:   endometrial biopsy  History of Present Illness:   Toni Wolf is a 57 y.o. G60P1001 PM female being seen today for the following concerns:  PMB: Today she notes that she had an episode of bleeding a few mos ago.  asted about a week- started BRB then brown.  Used a few pads per day.  Pt had been on estrogen-only for about 3-4 mos before she was restarted on progesterone.  No further bleeding since that time. Pt notes she has been on HRT treated- previously on estrogen and Mirena.  Mirena pulled; however, she was not started on progesterone.  Device was removed due to pelvic pain.  She is currently on HRT- patch and Prometrium day.  Will still have an occasional hot flash in the morning, but better than previously.  [place] completed 10/2022: 7.7cm uterus with 52m endometrium.  Normal ovaries bilaterally.     No LMP recorded. Patient is postmenopausal.     04/21/2022    3:46 PM 04/17/2021    2:48 PM  Depression screen PHQ 2/9  Decreased Interest 1 0  Down, Depressed, Hopeless 1 0  PHQ - 2 Score 2 0  Altered sleeping 0 1  Tired, decreased energy 1 1  Change in appetite 0 1  Feeling bad or failure about yourself  0 0  Trouble concentrating 0 0  Moving slowly or fidgety/restless 0 0  Suicidal thoughts 0 0  PHQ-9 Score 3 3     Review of Systems:   Pertinent items are noted in HPI Denies fever/chills, dizziness, headaches, visual disturbances, fatigue, shortness of breath, chest pain, abdominal pain, vomiting, no problems with bowel movements, urination, or intercourse unless otherwise stated above.  Pertinent History Reviewed:  Reviewed past medical,surgical, social, obstetrical and family history.  Reviewed problem list, medications and allergies. Physical Assessment:   Vitals:   11/17/22 1500  BP: 102/68  Pulse: 73  Weight: 152 lb (68.9 kg)  Height: '5\' 3"'$  (1.6 m)  Body  mass index is 26.93 kg/m.       Physical Examination:   General appearance: alert, well appearing, and in no distress  Psych: mood appropriate, normal affect  Skin: warm & dry   Cardiovascular: normal heart rate noted  Respiratory: normal respiratory effort, no distress  Abdomen: soft, non-tender   Pelvic: VULVA: normal appearing vulva with no masses, tenderness or lesions, VAGINA: normal appearing vagina with normal color and discharge, no lesions, CERVIX: normal appearing cervix without discharge or lesions, UTERUS: uterus is normal size, shape, consistency and nontender  Extremities: no edema   Chaperone: LAlice Rieger   Endometrial Biopsy Procedure Note  Pre-operative Diagnosis: PMB, Thickened endometrium  Post-operative Diagnosis: same  Procedure Details  The risks (including infection, bleeding, pain, and uterine perforation) and benefits of the procedure were explained to the patient and Written informed consent was obtained.  Antibiotic prophylaxis against endocarditis was not indicated.   The patient was placed in the dorsal lithotomy position.  Bimanual exam showed the uterus to be in the neutral position.  A speculum inserted in the vagina, and the cervix prepped with betadine.     A single tooth tenaculum was applied to the anterior lip of the cervix for stabilization.   A Pipelle endometrial aspirator was used to sample the endometrium.  Sample was sent for pathologic examination.  Condition: Stable  Complications: None    Assessment &  Plan:  1) PMB, thickened endometrium -reviewed US findings and recommendation for EMB -see above procedure -Next step pending results of pathology.   The patient was advised to call for any fever or for prolonged or severe pain or bleeding. She was advised to use OTC analgesics as needed for mild to moderate pain. She was advised to avoid vaginal intercourse for 48 hours or until the bleeding has completely stopped.  2)  HRT -for now plan to continue -discussed possible weening in the next few years or if bleeding persists -questions/concerns were addressed   Return if symptoms worsen or fail to improve or 9yrannual.   JJanyth Pupa DO Attending [place] [facility] [place]

## 2022-11-17 NOTE — Addendum Note (Signed)
Addended by: Octaviano Glow on: 11/17/2022 03:52 PM   Modules accepted: Orders

## 2022-11-18 ENCOUNTER — Encounter (INDEPENDENT_AMBULATORY_CARE_PROVIDER_SITE_OTHER): Payer: Self-pay | Admitting: Gastroenterology

## 2022-11-19 LAB — SURGICAL PATHOLOGY

## 2022-12-25 ENCOUNTER — Other Ambulatory Visit: Payer: Self-pay | Admitting: Adult Health

## 2022-12-28 ENCOUNTER — Ambulatory Visit (INDEPENDENT_AMBULATORY_CARE_PROVIDER_SITE_OTHER): Payer: 59 | Admitting: Gastroenterology

## 2022-12-29 ENCOUNTER — Other Ambulatory Visit: Payer: Self-pay | Admitting: Adult Health

## 2023-02-25 ENCOUNTER — Other Ambulatory Visit: Payer: Self-pay | Admitting: Adult Health

## 2023-04-26 ENCOUNTER — Other Ambulatory Visit (HOSPITAL_COMMUNITY): Payer: Self-pay | Admitting: Adult Health

## 2023-04-26 DIAGNOSIS — Z1231 Encounter for screening mammogram for malignant neoplasm of breast: Secondary | ICD-10-CM

## 2023-05-05 ENCOUNTER — Ambulatory Visit (HOSPITAL_COMMUNITY)
Admission: RE | Admit: 2023-05-05 | Discharge: 2023-05-05 | Disposition: A | Discharge status: Home / Self Care | Payer: BC Managed Care – PPO | Source: Ambulatory Visit | Attending: Adult Health | Admitting: Adult Health

## 2023-05-05 DIAGNOSIS — Z1231 Encounter for screening mammogram for malignant neoplasm of breast: Secondary | ICD-10-CM | POA: Diagnosis present

## 2023-05-10 ENCOUNTER — Ambulatory Visit (INDEPENDENT_AMBULATORY_CARE_PROVIDER_SITE_OTHER): Payer: BC Managed Care – PPO | Admitting: Gastroenterology

## 2023-05-10 ENCOUNTER — Encounter (INDEPENDENT_AMBULATORY_CARE_PROVIDER_SITE_OTHER): Payer: Self-pay | Admitting: Gastroenterology

## 2023-05-10 VITALS — BP 114/74 | HR 71 | Temp 97.5°F | Ht 63.0 in | Wt 156.4 lb

## 2023-05-10 DIAGNOSIS — K449 Diaphragmatic hernia without obstruction or gangrene: Secondary | ICD-10-CM

## 2023-05-10 DIAGNOSIS — K219 Gastro-esophageal reflux disease without esophagitis: Secondary | ICD-10-CM | POA: Diagnosis not present

## 2023-05-10 MED ORDER — VONOPRAZAN FUMARATE 20 MG PO TABS: 1.0000 | ORAL_TABLET | Freq: Every day | ORAL | 1 refills | Status: DC

## 2023-05-10 MED ORDER — VONOPRAZAN FUMARATE 10 MG PO TABS: 1.0000 | ORAL_TABLET | Freq: Every day | ORAL | 3 refills | Status: DC

## 2023-05-10 NOTE — Progress Notes (Signed)
Katrinka Blazing, M.D. Gastroenterology & Hepatology [facility] [address] [place], [place] [address]  Primary Care Physician: Richardean Chimera, MD [address] [place] [address]  I will communicate my assessment and recommendations to the referring MD via EMR.  Problems: GERD  History of Present Illness: Toni Wolf is a 57 y.o. female with past medical history of GERD, anxiety who presents for follow up of GERD.  The patient was last seen on 10/06/2022. At that time, the patient was advised to increase her Nexium and take Carafate as needed for reflux.  Patient was initially interested in undergoing TIF/hiatal hernia repair but she declined proceeding with this in early 2024.  Patient reports that around 3-4 weeks she noticed having worsening episodes of regurgitation of acid. She states this all started after presenting a recent sinus infection. She has felt some nausea in recurrent episodes without vomiting. States that her symptoms have persisted despite taking her Nexium every day.   Got concerned as she had dysphagia while eating fried chicken recently which has never happened.  She felt some mild dysphagia in the past but this was never something that was concerning for her.  She is currently taking Nexium twice a day, waits at least 30 minutes before meals. Feels that the brand medication worked a little better than the generic. Tried taking some Pepcid recently which helped some.  Previously used meds: Prilosec, Protonix, famotidine, Zegerid, Aciphex  The patient denies having any nausea, vomiting, fever, chills, hematochezia, melena, hematemesis, abdominal distention, abdominal pain, diarrhea, jaundice, pruritus or weight loss.  Last Colonoscopy:03/13/21 One 3 mm polyp in the descending colon-tubular adenoma - One 6 mm polyp in the sigmoid colon-tubular adenoma - External hemorrhoids. - Anal papilla(e) were  hypertrophied Last Endoscopy:- 02/12/22 Normal hypopharynx. - Normal esophagus. - Z-line irregular, 33 cm from the incisors. Biopsied (Gastroesophageal mucosa with mild inflammation consistent with reflux) - 3 cm hiatal hernia. - Gastritis. Biopsied. - Normal duodenal bulb and second portion of the duodenum.  Past Medical History: Past Medical History:  Diagnosis Date   Anxiety    ASCUS of cervix with negative high risk HPV 04/23/2021   04/23/21 repeat pap in 3 years per [facility]   GERD (gastroesophageal reflux disease)    Hiatal hernia    Seasonal allergies     Past Surgical History: Past Surgical History:  Procedure Laterality Date   BIOPSY  03/13/2021   Procedure: BIOPSY;  Surgeon: Malissa Hippo, MD;  [facility];  Service: Endoscopy;;  descending colon polyp   BIOPSY  02/12/2022   Procedure: BIOPSY;  Surgeon: Malissa Hippo, MD;  [facility];  Service: Endoscopy;;   CHOLECYSTECTOMY     COLONOSCOPY N/A 01/23/2016   Procedure: COLONOSCOPY;  Surgeon: Malissa Hippo, MD;  [facility]: [facility];  Service: Endoscopy;  Laterality: N/A;  830   COLONOSCOPY N/A 03/13/2021   Rehman: 3mm tubular adenoma in descending colon, 6mm tubular adenoma in sigmoid colon, external hemorrhoids, anal papillae hypertrophied   ESOPHAGOGASTRODUODENOSCOPY (EGD) WITH PROPOFOL N/A 02/12/2022   Procedure: ESOPHAGOGASTRODUODENOSCOPY (EGD) WITH PROPOFOL;  Surgeon: Malissa Hippo, MD;  [facility];  Service: Endoscopy;  Laterality: N/A;  115   LAPAROSCOPY     NASAL SEPTUM SURGERY     POLYPECTOMY  03/13/2021   Procedure: POLYPECTOMY;  Surgeon: Malissa Hippo, MD;  [facility];  Service: Endoscopy;;  sigmoid colon polyp    Family  History: Family History  Problem Relation Age of Onset   Crohn's disease Mother    Cirrhosis Mother    Diabetes Father    Heart disease Father    COPD Father    Hypertension Father    Arthritis Sister    Heart disease  Maternal Grandmother    Diabetes Maternal Grandmother    Arthritis Maternal Grandmother    Heart disease Paternal Grandfather    Diabetes Paternal Grandfather    Colon cancer Neg Hx     Social History: Social History   Tobacco Use  Smoking Status Never   Passive exposure: Never  Smokeless Tobacco Never   Social History   Substance and Sexual Activity  Alcohol Use Yes   Comment: 1 glass of wine  a year   Social History   Substance and Sexual Activity  Drug Use No    Allergies: Allergies  Allergen Reactions   Latex Rash    Medications: Current Outpatient Medications  Medication Sig Dispense Refill   Calcium Carbonate-Vitamin D (CALCIUM 600+D PO) Take 1 tablet by mouth daily.     docusate sodium (COLACE) 100 MG capsule Take 100 mg by mouth at bedtime. 2 at bedtime     escitalopram (LEXAPRO) 10 MG tablet Take 5 mg by mouth at bedtime.     esomeprazole (NEXIUM) 20 MG packet Take 20 mg by mouth. Takes 1 tab before breakfast and 2 tabs before dinner     estradiol (VIVELLE-DOT) 0.0375 MG/24HR PLACE 1 PATCH ONTO THE SKIN 2 TIMES A WEEK. 24 patch 7   fluconazole (DIFLUCAN) 150 MG tablet Take 1 now and 1 in 3 days 2 tablet 1   fluticasone (FLONASE) 50 MCG/ACT nasal spray Place 1 spray into both nostrils 2 (two) times daily as needed for allergies or rhinitis.     loratadine (CLARITIN) 10 MG tablet Take 10 mg by mouth daily.     Multiple Vitamins-Minerals (MULTIVITAMIN WITH MINERALS) tablet Take 1 tablet by mouth daily.     ondansetron (ZOFRAN) 4 MG tablet Take 1 tablet (4 mg total) by mouth every 8 (eight) hours as needed for nausea or vomiting. 30 tablet 1   polyethylene glycol (MIRALAX / GLYCOLAX) 17 g packet Take 17 g by mouth daily as needed for mild constipation.     PROCTOSOL HC 2.5 % rectal cream Place 1 application. rectally 2 (two) times daily as needed for hemorrhoids.     progesterone (PROMETRIUM) 100 MG capsule TAKE 1 CAPSULE BY MOUTH AT BEDTIME. 90 capsule 4    Propylene Glycol (SYSTANE COMPLETE) 0.6 % SOLN Place 1 drop into both eyes in the morning and at bedtime.     triamcinolone cream (KENALOG) 0.1 % Apply 1 application. topically 3 (three) times daily as needed for rash.     vitamin B-12 (CYANOCOBALAMIN) 1000 MCG tablet Take 1,000 mcg by mouth daily.     No current facility-administered medications for this visit.    Review of Systems: GENERAL: negative for malaise, night sweats HEENT: No changes in hearing or vision, no nose bleeds or other nasal problems. NECK: Negative for lumps, goiter, pain and significant neck swelling RESPIRATORY: Negative for cough, wheezing CARDIOVASCULAR: Negative for chest pain, leg swelling, palpitations, orthopnea GI: SEE HPI MUSCULOSKELETAL: Negative for joint pain or swelling, back pain, and muscle pain. SKIN: Negative for lesions, rash PSYCH: Negative for sleep disturbance, mood disorder and recent psychosocial stressors. HEMATOLOGY Negative for prolonged bleeding, bruising easily, and swollen nodes. ENDOCRINE: Negative for cold or heat intolerance,  polyuria, polydipsia and goiter. NEURO: negative for tremor, gait imbalance, syncope and seizures. The remainder of the review of systems is noncontributory.   Physical Exam: BP 114/74 (BP Location: Left Arm, Patient Position: Sitting, Cuff Size: Small)   Pulse 71   Temp (!) 97.5 F (36.4 C) (Temporal)   Ht 5\' 3"  (1.6 m)   Wt 156 lb 6.4 oz (70.9 kg)   BMI 27.71 kg/m  GENERAL: The patient is AO x3, in no acute distress. HEENT: Head is normocephalic and atraumatic. EOMI are intact. Mouth is well hydrated and without lesions. NECK: Supple. No masses LUNGS: Clear to auscultation. No presence of rhonchi/wheezing/rales. Adequate chest expansion HEART: RRR, normal s1 and s2. ABDOMEN: Soft, nontender, no guarding, no peritoneal signs, and nondistended. BS +. No masses. EXTREMITIES: Without any cyanosis, clubbing, rash, lesions or edema. NEUROLOGIC: AOx3, no  focal motor deficit. SKIN: no jaundice, no rashes  Imaging/Labs: as above  I personally reviewed and interpreted the available labs, imaging and endoscopic files.  Impression and Plan: JODILYN BYARD is a 57 y.o. female with past medical history of GERD, anxiety who presents for follow up of GERD.  Patient had recent exacerbation of her reflux symptoms despite taking her medication compliantly in the proper fashion.  Has not presented any red flag signs but endorsed having some dysphagia recently.  Notably she had a relatively recent endoscopy that did not show any anatomical abnormalities leading to her dysphagia.  We had a very thorough discussion regarding the fact that she has been on multiple PPIs with very poor improvement.  She does not have a lot of options for managing her reflux episodes.  We will try requesting a for now as she has not improved with multiple PPIs.  She should also try to avoid triggering foods for now.  However, if her symptoms persist she will need to reconsider undergoing cTIF.  If so, she will need to have an esophageal manometry performed.  -Start Voquezna 20 mg every day for 8 weeks, then decrease to 10 milligrams every day -Stop Nexium -Try to avoid triggering foods and work on weight loss -If interested in pursuing cTIF, please let us know as we will need to proceed with esophageal manometry  All questions were answered.      Katrinka Blazing, MD Gastroenterology and Hepatology [facility]

## 2023-05-10 NOTE — Patient Instructions (Signed)
Start Voquezna 20 mg every day for 8 weeks, then decrease to 10 milligrams every day Stop Nexium Try to avoid triggering foods and work on weight loss If interested in pursuing cTIF, please let us know as we will need to proceed with esophageal manometry

## 2023-07-05 ENCOUNTER — Other Ambulatory Visit: Payer: Self-pay | Admitting: Adult Health

## 2023-07-05 MED ORDER — FLUCONAZOLE 150 MG PO TABS: ORAL_TABLET | ORAL | 2 refills | Status: DC

## 2023-07-05 NOTE — Progress Notes (Signed)
Will refill diflucan 

## 2023-07-14 ENCOUNTER — Other Ambulatory Visit (INDEPENDENT_AMBULATORY_CARE_PROVIDER_SITE_OTHER): Payer: Self-pay | Admitting: Gastroenterology

## 2023-07-14 ENCOUNTER — Encounter (INDEPENDENT_AMBULATORY_CARE_PROVIDER_SITE_OTHER): Payer: Self-pay | Admitting: Gastroenterology

## 2023-07-14 DIAGNOSIS — K219 Gastro-esophageal reflux disease without esophagitis: Secondary | ICD-10-CM

## 2023-07-14 MED ORDER — ESOMEPRAZOLE MAGNESIUM 40 MG PO CPDR
40.0000 mg | DELAYED_RELEASE_CAPSULE | Freq: Two times a day (BID) | ORAL | 3 refills | Status: DC
Start: 2023-07-14 — End: 2023-09-14

## 2023-08-19 ENCOUNTER — Encounter (INDEPENDENT_AMBULATORY_CARE_PROVIDER_SITE_OTHER): Payer: Self-pay | Admitting: Gastroenterology

## 2023-08-23 ENCOUNTER — Other Ambulatory Visit (INDEPENDENT_AMBULATORY_CARE_PROVIDER_SITE_OTHER): Payer: Self-pay | Admitting: *Deleted

## 2023-08-23 DIAGNOSIS — K449 Diaphragmatic hernia without obstruction or gangrene: Secondary | ICD-10-CM

## 2023-08-23 DIAGNOSIS — K219 Gastro-esophageal reflux disease without esophagitis: Secondary | ICD-10-CM

## 2023-08-23 MED ORDER — VONOPRAZAN FUMARATE 10 MG PO TABS
1.0000 | ORAL_TABLET | Freq: Every day | ORAL | 1 refills | Status: DC
Start: 2023-08-23 — End: 2023-12-14

## 2023-08-27 ENCOUNTER — Encounter (INDEPENDENT_AMBULATORY_CARE_PROVIDER_SITE_OTHER): Payer: Self-pay | Admitting: Gastroenterology

## 2023-09-14 ENCOUNTER — Encounter (INDEPENDENT_AMBULATORY_CARE_PROVIDER_SITE_OTHER): Payer: Self-pay | Admitting: Gastroenterology

## 2023-09-14 ENCOUNTER — Ambulatory Visit (INDEPENDENT_AMBULATORY_CARE_PROVIDER_SITE_OTHER): Payer: BC Managed Care – PPO | Admitting: Gastroenterology

## 2023-09-14 VITALS — BP 91/64 | HR 64 | Temp 97.8°F | Ht 63.0 in | Wt 159.8 lb

## 2023-09-14 DIAGNOSIS — K219 Gastro-esophageal reflux disease without esophagitis: Secondary | ICD-10-CM | POA: Diagnosis not present

## 2023-09-14 DIAGNOSIS — K2100 Gastro-esophageal reflux disease with esophagitis, without bleeding: Secondary | ICD-10-CM

## 2023-09-14 NOTE — Patient Instructions (Signed)
Please continue with voquezna 10mg  daily for now Let us know if you would like to proceed with TIF or if you have any further questions  Follow up 3 months  It was a pleasure to see you today. I want to create trusting relationships with patients and provide genuine, compassionate, and quality care. I truly value your feedback! please be on the lookout for a survey regarding your visit with me today. I appreciate your input about our visit and your time in completing this!    Saahir Prude L. Jeanmarie Hubert, MSN, APRN, AGNP-C Adult-Gerontology Nurse Practitioner [facility] at [facility]

## 2023-09-14 NOTE — Progress Notes (Addendum)
Referring Provider: Richardean Chimera, MD Primary Care Physician:  Richardean Chimera, MD Primary GI Physician: Dr. Levon Hedger   Chief Complaint  Patient presents with   Follow-up    Patient here today for a follow up on her Toni Wolf. Patient says this is some what better,but still having issues. She is taking vonoprazan 10 mg Q am.    HPI:   Toni Wolf is a 57 y.o. female with past medical history of GERD and Anxiety   Patient presenting today for follow up of GERD  Patient last seen July/2024, at that time patient reported having worsening episodes of regurgitation of acid about 3 to 4 weeks prior.  Also with some nausea without vomiting.  Taking Nexium twice daily at that time.  Patient also with dysphagia to chicken x 1.  Had also tried Pepcid recently which seem to help.  Previously on Prilosec, Protonix, famotidine, Xagrid, Aciphex in the past  Patient recommended to stop Nexium, start requesting at 20 mg daily x 8 weeks then decrease to 10 mg daily thereafter, avoid triggering foods and work on weight loss, patient to let us know if she is interested in cTIF  Present: States she is feeling better on voquezna 10mg  daily. She notes that she still has some burning in her throat and chest but this is on occasional, mostly has continued a globus sensation with the need to clear her throat or cough fairly often. She notes that she can hear gurgling in her throat often. Nausea has resolved. She is trying to avoid certain trigger foods. Notes she is tolerating coffee better now.  States that she had an episode yesterday where she got off the treadmill and felt shaky and sweaty, notes she had not eaten much before hand. She was craving something sweet. Felt better after she ate.   Last Colonoscopy:03/13/21 One 3 mm polyp in the descending colon-tubular adenoma - One 6 mm polyp in the sigmoid colon-tubular adenoma - External hemorrhoids. - Anal papilla(e) were hypertrophied Last Endoscopy:-  02/12/22 Normal hypopharynx. - Normal esophagus. - Z-line irregular, 33 cm from the incisors. Biopsied (Gastroesophageal mucosa with mild inflammation consistent with reflux) - 3 cm hiatal hernia. - Gastritis. Biopsied. - Normal duodenal bulb and second portion of the duodenum.  Recommendations:    Past Medical History:  Diagnosis Date   Anxiety    ASCUS of cervix with negative high risk HPV 04/23/2021   04/23/21 repeat pap in 3 years per [facility]   GERD (gastroesophageal reflux disease)    Hiatal hernia    Seasonal allergies     Past Surgical History:  Procedure Laterality Date   BIOPSY  03/13/2021   Procedure: BIOPSY;  Surgeon: Malissa Hippo, MD;  [facility]: [facility];  Service: Endoscopy;;  descending colon polyp   BIOPSY  02/12/2022   Procedure: BIOPSY;  Surgeon: Malissa Hippo, MD;  Location: [facility];  Service: Endoscopy;;   CHOLECYSTECTOMY     COLONOSCOPY N/A 01/23/2016   Procedure: COLONOSCOPY;  Surgeon: Malissa Hippo, MD;  Location: [facility];  Service: Endoscopy;  Laterality: N/A;  830   COLONOSCOPY N/A 03/13/2021   Rehman: 3mm tubular adenoma in descending colon, 6mm tubular adenoma in sigmoid colon, external hemorrhoids, anal papillae hypertrophied   ESOPHAGOGASTRODUODENOSCOPY (EGD) WITH PROPOFOL N/A 02/12/2022   Procedure: ESOPHAGOGASTRODUODENOSCOPY (EGD) WITH PROPOFOL;  Surgeon: Malissa Hippo, MD;  Location: [facility];  Service: Endoscopy;  Laterality: N/A;  115   LAPAROSCOPY  NASAL SEPTUM SURGERY     POLYPECTOMY  03/13/2021   Procedure: POLYPECTOMY;  Surgeon: Malissa Hippo, MD;  Location: [facility];  Service: Endoscopy;;  sigmoid colon polyp    Current Outpatient Medications  Medication Sig Dispense Refill   Calcium Carbonate-Vitamin D (CALCIUM 600+D PO) Take 1 tablet by mouth daily.     docusate sodium (COLACE) 100 MG capsule Take 100 mg by mouth at bedtime. 2 at bedtime     escitalopram (LEXAPRO) 10 MG tablet Take 5 mg by mouth at  bedtime.     estradiol (VIVELLE-DOT) 0.0375 MG/24HR PLACE 1 PATCH ONTO THE SKIN 2 TIMES A WEEK. 24 patch 7   fluconazole (DIFLUCAN) 150 MG tablet Take 1 now and 1 in 3 days 2 tablet 2   fluticasone (FLONASE) 50 MCG/ACT nasal spray Place 1 spray into both nostrils 2 (two) times daily as needed for allergies or rhinitis.     loratadine (CLARITIN) 10 MG tablet Take 10 mg by mouth daily.     Multiple Vitamins-Minerals (MULTIVITAMIN WITH MINERALS) tablet Take 1 tablet by mouth daily.     ondansetron (ZOFRAN) 4 MG tablet Take 1 tablet (4 mg total) by mouth every 8 (eight) hours as needed for nausea or vomiting. 30 tablet 1   polyethylene glycol (MIRALAX / GLYCOLAX) 17 g packet Take 17 g by mouth daily as needed for mild constipation.     PROCTOSOL HC 2.5 % rectal cream Place 1 application. rectally 2 (two) times daily as needed for hemorrhoids.     progesterone (PROMETRIUM) 100 MG capsule TAKE 1 CAPSULE BY MOUTH AT BEDTIME. 90 capsule 4   Propylene Glycol (SYSTANE COMPLETE) 0.6 % SOLN Place 1 drop into both eyes in the morning and at bedtime.     triamcinolone cream (KENALOG) 0.1 % Apply 1 application. topically 3 (three) times daily as needed for rash.     vitamin B-12 (CYANOCOBALAMIN) 1000 MCG tablet Take 1,000 mcg by mouth daily.     Vonoprazan Fumarate 10 MG TABS Take 1 each by mouth daily. 90 tablet 1   No current facility-administered medications for this visit.    Allergies as of 09/14/2023 - Review Complete 09/14/2023  Allergen Reaction Noted   Amoxicillin Nausea Only 09/14/2023   Latex Rash 04/17/2021    Family History  Problem Relation Age of Onset   Crohn's disease Mother    Cirrhosis Mother    Diabetes Father    Heart disease Father    COPD Father    Hypertension Father    Arthritis Sister    Heart disease Maternal Grandmother    Diabetes Maternal Grandmother    Arthritis Maternal Grandmother    Heart disease Paternal Grandfather    Diabetes Paternal Grandfather    Colon  cancer Neg Hx     Social History   Socioeconomic History   Marital status: Married    Spouse name: Not on file   Number of children: 1   Years of education: Not on file   Highest education level: Not on file  Occupational History   Not on file  Tobacco Use   Smoking status: Never    Passive exposure: Never   Smokeless tobacco: Never  Vaping Use   Vaping status: Never Used  Substance and Sexual Activity   Alcohol use: Yes    Comment: 1 glass of wine  a year   Drug use: No   Sexual activity: Not Currently    Birth control/protection: Post-menopausal  Other Topics  Concern   Not on file  Social History Narrative   Not on file   Social Determinants of Health   Financial Resource Strain: Low Risk  (04/21/2022)   Overall Financial Resource Strain (CARDIA)    Difficulty of Paying Living Expenses: Not hard at all  Food Insecurity: No Food Insecurity (04/21/2022)   Hunger Vital Sign    Worried About Running Out of Food in the Last Year: Never true    Ran Out of Food in the Last Year: Never true  Transportation Needs: No Transportation Needs (04/21/2022)   PRAPARE - Administrator, Civil Service (Medical): No    Lack of Transportation (Non-Medical): No  Physical Activity: Sufficiently Active (04/21/2022)   Exercise Vital Sign    Days of Exercise per Week: 3 days    Minutes of Exercise per Session: 60 min  Stress: No Stress Concern Present (04/21/2022)   [employer] of Occupational Health - Occupational Stress Questionnaire    Feeling of Stress : Only a little  Social Connections: Socially Integrated (04/21/2022)   Social Connection and Isolation Panel [NHANES]    Frequency of Communication with Friends and Family: More than three times a week    Frequency of Social Gatherings with Friends and Family: Three times a week    Attends Religious Services: More than 4 times per year    Active Member of Clubs or Organizations: Yes    Attends Hospital doctor: More than 4 times per year    Marital Status: Married    Review of systems General: negative for malaise, night sweats, fever, chills, weight loss Neck: Negative for lumps, goiter, pain and significant neck swelling Resp: Negative for cough, wheezing, dyspnea at rest CV: Negative for chest pain, leg swelling, palpitations, orthopnea GI: denies melena, hematochezia, nausea, vomiting, diarrhea, constipation, dysphagia, odyonophagia, early satiety or unintentional weight loss. +globus sensation  The remainder of the review of systems is noncontributory.  Physical Exam: BP 91/64 (BP Location: Left Arm, Patient Position: Sitting, Cuff Size: Normal)   Pulse 64   Temp 97.8 F (36.6 C) (Temporal)   Ht 5\' 3"  (1.6 m)   Wt 159 lb 12.8 oz (72.5 kg)   BMI 28.31 kg/m  General:   Alert and oriented. No distress noted. Pleasant and cooperative.  Head:  Normocephalic and atraumatic. Eyes:  Conjuctiva clear without scleral icterus. Mouth:  Oral mucosa pink and moist. Good dentition. No lesions. Abdomen:  +BS, soft, non-tender and non-distended. No rebound or guarding. No HSM or masses noted. Neurologic:  Alert and  oriented x4 Psych:  Alert and cooperative. Normal mood and affect.  Invalid input(s): "6 MONTHS"   ASSESSMENT: Toni Wolf is a 57 y.o. female presenting today for follow up of GERD  Patient doing better on Voquezna 10 mg daily, nausea has resolved on this, she feels this has worked better than other acid reflux medicine she has been on in the past.  She still has some occasional heartburn depending on what she has eaten but also notes a continued globus sensation.  She had plans to proceed with C TIF procedure in the past but notes she got nervous about this and canceled.  At this time she feels that she likely needs to have this procedure done but would like to think about it and discuss with her husband a little bit further.  She will let me know after the holidays how  she would like to proceed.  For now we  will continue with requested to milligrams daily, good reflux precautions.  Patient will need esophageal manometry prior to cTIF procedure if she does decide to proceed.   Suspect episode of diaphoresis and shakiness at the gym yesterday was related to low blood sugar as symptoms improved after eating. Patient encouraged to try eating a higher carb snack prior to working out. She should follow up with PCP if this recurs.    PLAN:  Continue with voquezna 10mg  daily  2. Consider cTIF  3. Good reflux precautions   All questions were answered, patient verbalized understanding and is in agreement with plan as outlined above.   Follow Up: 3 months   Makeya Hilgert L. Jeanmarie Hubert, MSN, APRN, AGNP-C Adult-Gerontology Nurse Practitioner [facility]  I have reviewed the note and agree with the APP's assessment as described in this progress note  Katrinka Blazing, MD Gastroenterology and Hepatology [facility]

## 2023-12-14 ENCOUNTER — Telehealth (INDEPENDENT_AMBULATORY_CARE_PROVIDER_SITE_OTHER): Payer: Self-pay

## 2023-12-14 DIAGNOSIS — K219 Gastro-esophageal reflux disease without esophagitis: Secondary | ICD-10-CM

## 2023-12-14 DIAGNOSIS — K449 Diaphragmatic hernia without obstruction or gangrene: Secondary | ICD-10-CM

## 2023-12-14 MED ORDER — VONOPRAZAN FUMARATE 10 MG PO TABS
1.0000 | ORAL_TABLET | Freq: Every day | ORAL | 1 refills | Status: DC
Start: 2023-12-14 — End: 2024-04-17

## 2023-12-17 NOTE — Telephone Encounter (Signed)
 Error

## 2023-12-20 ENCOUNTER — Telehealth (INDEPENDENT_AMBULATORY_CARE_PROVIDER_SITE_OTHER): Payer: Self-pay

## 2023-12-20 NOTE — Telephone Encounter (Signed)
 Thanks for the update

## 2023-12-20 NOTE — Telephone Encounter (Signed)
 Mitzie, I sent you the vm she had left to cancel the appointment scheduled for tomorrow. Thanks, CS  Patient called to cancel her appointment scheduled here tomorrow 12/21/2023 w Joie Narrow.  Patient wanted to let you all know she has decided to not go through with Hiatal hernia surgery.

## 2023-12-21 ENCOUNTER — Ambulatory Visit (INDEPENDENT_AMBULATORY_CARE_PROVIDER_SITE_OTHER): Payer: BC Managed Care – PPO | Admitting: Gastroenterology

## 2024-01-15 ENCOUNTER — Encounter (INDEPENDENT_AMBULATORY_CARE_PROVIDER_SITE_OTHER): Payer: Self-pay | Admitting: Gastroenterology

## 2024-02-02 ENCOUNTER — Other Ambulatory Visit: Payer: Self-pay | Admitting: Adult Health

## 2024-02-08 ENCOUNTER — Ambulatory Visit: Payer: Self-pay | Admitting: Adult Health

## 2024-02-08 ENCOUNTER — Other Ambulatory Visit (HOSPITAL_COMMUNITY)
Admission: RE | Admit: 2024-02-08 | Discharge: 2024-02-08 | Disposition: A | Discharge status: Home / Self Care | Source: Ambulatory Visit | Attending: Adult Health | Admitting: Adult Health

## 2024-02-08 ENCOUNTER — Encounter: Payer: Self-pay | Admitting: Adult Health

## 2024-02-08 VITALS — BP 107/70 | HR 59 | Ht 63.0 in | Wt 156.0 lb

## 2024-02-08 DIAGNOSIS — Z1331 Encounter for screening for depression: Secondary | ICD-10-CM | POA: Diagnosis not present

## 2024-02-08 DIAGNOSIS — Z01419 Encounter for gynecological examination (general) (routine) without abnormal findings: Secondary | ICD-10-CM | POA: Insufficient documentation

## 2024-02-08 DIAGNOSIS — Z1211 Encounter for screening for malignant neoplasm of colon: Secondary | ICD-10-CM | POA: Diagnosis not present

## 2024-02-08 DIAGNOSIS — Z79890 Hormone replacement therapy (postmenopausal): Secondary | ICD-10-CM | POA: Diagnosis not present

## 2024-02-08 LAB — HEMOCCULT GUIAC POC 1CARD (OFFICE): Fecal Occult Blood, POC: NEGATIVE

## 2024-02-08 MED ORDER — ESTRADIOL 0.0375 MG/24HR TD PTTW: 1.0000 | MEDICATED_PATCH | TRANSDERMAL | 3 refills | Status: DC

## 2024-02-08 NOTE — Progress Notes (Signed)
 Patient ID: Toni Wolf, female   DOB: 07/28/1966, 58 y.o.   MRN: [id number] History of Present Illness: Toni Wolf is a 58 year old white female,married, PM in for a well woman gyn exam and pap. She is on HRT and doing well. She is working as a Office manager and drives a bus.  PCP is Dr Reuel Boom.    Current Medications, Allergies, Past Medical History, Past Surgical History, Family History and Social History were reviewed in [employer] record.     Review of Systems: Patient denies any headaches, hearing loss, fatigue, blurred vision, shortness of breath, chest pain, abdominal pain, problems with bowel movements(has had diarrhea after eating recently, ?IBS), urination, or intercourse(not active). No joint pain or mood swings.  Denies any vaginal bleeding   Physical Exam:BP 107/70 (BP Location: Right Arm, Patient Position: Sitting, Cuff Size: Normal)   Pulse (!) 59   Ht 5\' 3"  (1.6 m)   Wt 156 lb (70.8 kg)   BMI 27.63 kg/m   General:  Well developed, well nourished, no acute distress Skin:  Warm and dry Neck:  Midline trachea, normal thyroid, good ROM, no lymphadenopathy Lungs; Clear to auscultation bilaterally Breast:  No dominant palpable mass, retraction, or nipple discharge Cardiovascular: Regular rate and rhythm Abdomen:  Soft, non tender, no hepatosplenomegaly Pelvic:  External genitalia is normal in appearance, no lesions.  The vagina is pale. Urethra has no lesions or masses. The cervix is smooth, pap with HR HPV genotyping performed.  Uterus is felt to be normal size, shape, and contour.  No adnexal masses or tenderness noted.Bladder is non tender, no masses felt. Rectal: Good sphincter tone, no polyps, or hemorrhoids felt.  Hemoccult negative. Extremities/musculoskeletal:  No swelling or varicosities noted, no clubbing or cyanosis Psych:  No mood changes, alert and cooperative,seems happy AA is 1 Fall risk is low    02/08/2024    3:28 PM 04/21/2022    3:46 PM  04/17/2021    2:48 PM  Depression screen PHQ 2/9  Decreased Interest 0 1 0  Down, Depressed, Hopeless 0 1 0  PHQ - 2 Score 0 2 0  Altered sleeping 0 0 1  Tired, decreased energy 1 1 1   Change in appetite 1 0 1  Feeling bad or failure about yourself  0 0 0  Trouble concentrating 0 0 0  Moving slowly or fidgety/restless 0 0 0  Suicidal thoughts 0 0 0  PHQ-9 Score 2 3 3        02/08/2024    3:29 PM 04/21/2022    3:46 PM 04/17/2021    2:48 PM  GAD 7 : Generalized Anxiety Score  Nervous, Anxious, on Edge 0 1 0  Control/stop worrying 1 0 1  Worry too much - different things 1 0 0  Trouble relaxing 0 0 0  Restless 0 0 0  Easily annoyed or irritable 0 1 0  Afraid - awful might happen 0 0 0  Total GAD 7 Score 2 2 1       Upstream - 02/08/24 1544       Pregnancy Intention Screening   Does the patient want to become pregnant in the next year? N/A    Does the patient's partner want to become pregnant in the next year? N/A    Would the patient like to discuss contraceptive options today? N/A      Contraception Wrap Up   Current Method Abstinence   PM   End Method Abstinence   PM  Contraception Counseling Provided No               Examination chaperoned by Malachy Mood LPN    Impression and plan: 1. Encounter for gynecological examination with Papanicolaou smear of cervix (Primary) Pap sent Pap in 3 years if normal Physical in 1 year Mammogram was negative 05/05/23 Colonoscopy per GI Labs with PCP - Cytology - PAP( [place]) Can use aquaphor 3N1 cream if has irritation after BM  2. Hormone replacement therapy (HRT) Refilled vivelle patch 0.0375 mg  Meds ordered this encounter  Medications   estradiol (VIVELLE-DOT) 0.0375 MG/24HR    Sig: Place 1 patch onto the skin 2 (two) times a week.    Dispense:  24 patch    Refill:  3    Supervising Provider:   Duane Lope H [[id number]]    Has refills on Prometrium 100 mg at HS  3. Encounter for screening fecal occult blood  testing Hemoccult was negative

## 2024-02-10 ENCOUNTER — Ambulatory Visit (INDEPENDENT_AMBULATORY_CARE_PROVIDER_SITE_OTHER): Payer: Self-pay | Admitting: Gastroenterology

## 2024-02-10 ENCOUNTER — Encounter (INDEPENDENT_AMBULATORY_CARE_PROVIDER_SITE_OTHER): Payer: Self-pay | Admitting: Gastroenterology

## 2024-02-10 VITALS — BP 102/68 | HR 67 | Temp 97.8°F | Ht 63.0 in | Wt 156.1 lb

## 2024-02-10 DIAGNOSIS — K449 Diaphragmatic hernia without obstruction or gangrene: Secondary | ICD-10-CM

## 2024-02-10 DIAGNOSIS — K219 Gastro-esophageal reflux disease without esophagitis: Secondary | ICD-10-CM

## 2024-02-10 DIAGNOSIS — R0989 Other specified symptoms and signs involving the circulatory and respiratory systems: Secondary | ICD-10-CM

## 2024-02-10 NOTE — Patient Instructions (Signed)
 Schedule EGD [place] on PPI with Dr. Tasia Catchings Continue Voquezna 10 mg every day Consider discussing with PCP possible allergy medication for seasonal allergies

## 2024-02-10 NOTE — H&P (View-Only) (Signed)
 Samantha Cress, M.D. Gastroenterology & Hepatology [facility] [address] [place], [place] [address]  Primary Care Physician: Leesa Pulling, MD [address] [place] [address]  I will communicate my assessment and recommendations to the referring MD via EMR.  Problems: GERD Hiatal hernia Throat clearing  History of Present Illness: Toni Wolf is a 58 y.o. female with past medical history of GERD and anxiety, who presents for follow up of GERD and throat clearing.  The patient was last seen on 09/14/2019 for. At that time, the patient was continued on requested 10 mg daily and advised to reconsider cTIF.  Patient reports that she is waking up with constant throat clearing recently, especially with the pollen from the spring season. She also reports that she has had some burning in her throat. When she eats food that has acid contents, she may have some heartburn.  However, her heartburn is not the most prevalent symptom but throat clearing and discomfort in the back of her throat.  She tried nexium  40 mg BID but her symptoms were worse, so now she is back on Voquezna  10 mg qday. She has presented some improvement with Voquezna  versus Nexium .  Patient states that when she felt her symptoms were worse was when she had a respiratory infection and that is why she asked to be switched to the Nexium  but her heartburn improved after this.  She also reports that she has also presented some bloating issues which lasted for 3 days. She took Miralax and it improved. This usually happens after having a meal. This has been ongoing for a week.  Denies having any nausea.  The patient denies having any nausea, vomiting, fever, chills, hematochezia, melena, hematemesis, abdominal pain, diarrhea, jaundice, pruritus or weight loss.  Last Colonoscopy:03/13/21 One 3 mm polyp in the descending colon-tubular adenoma - One 6 mm polyp in the sigmoid  colon-tubular adenoma - External hemorrhoids. - Anal papilla(e) were hypertrophied Last Endoscopy:- 02/12/22 Normal hypopharynx. - Normal esophagus. - Z-line irregular, 33 cm from the incisors. Biopsied (Gastroesophageal mucosa with mild inflammation consistent with reflux) - 3 cm hiatal hernia. - Gastritis. Biopsied. - Normal duodenal bulb and second portion of the duodenum.  Past Medical History: Past Medical History:  Diagnosis Date   Anxiety    ASCUS of cervix with negative high risk HPV 04/23/2021   04/23/21 repeat pap in 3 years per [facility]   GERD (gastroesophageal reflux disease)    Hiatal hernia    Seasonal allergies     Past Surgical History: Past Surgical History:  Procedure Laterality Date   BIOPSY  03/13/2021   Procedure: BIOPSY;  Surgeon: Ruby Corporal, MD;  [facility];  Service: Endoscopy;;  descending colon polyp   BIOPSY  02/12/2022   Procedure: BIOPSY;  Surgeon: Ruby Corporal, MD;  [facility];  Service: Endoscopy;;   CHOLECYSTECTOMY     COLONOSCOPY N/A 01/23/2016   Procedure: COLONOSCOPY;  Surgeon: Ruby Corporal, MD;  [facility];  Service: Endoscopy;  Laterality: N/A;  830   COLONOSCOPY N/A 03/13/2021   Rehman: 3mm tubular adenoma in descending colon, 6mm tubular adenoma in sigmoid colon, external hemorrhoids, anal papillae hypertrophied   ESOPHAGOGASTRODUODENOSCOPY (EGD) WITH PROPOFOL  N/A 02/12/2022   Procedure: ESOPHAGOGASTRODUODENOSCOPY (EGD) WITH PROPOFOL ;  Surgeon: Ruby Corporal, MD;  [facility];  Service: Endoscopy;  Laterality: N/A;  115   LAPAROSCOPY     NASAL SEPTUM SURGERY  POLYPECTOMY  03/13/2021   Procedure: POLYPECTOMY;  Surgeon: Ruby Corporal, MD;  [facility];  Service: Endoscopy;;  sigmoid colon polyp    Family History: Family History  Problem Relation Age of Onset   Crohn's disease Mother    Cirrhosis Mother    Diabetes Father    Heart disease Father    COPD Father     Hypertension Father    Arthritis Sister    Heart disease Maternal Grandmother    Diabetes Maternal Grandmother    Arthritis Maternal Grandmother    Heart disease Paternal Grandfather    Diabetes Paternal Grandfather    Colon cancer Neg Hx     Social History: Social History   Tobacco Use  Smoking Status Never   Passive exposure: Never  Smokeless Tobacco Never   Social History   Substance and Sexual Activity  Alcohol  Use Yes   Comment: 1 glass of wine  a year   Social History   Substance and Sexual Activity  Drug Use No    Allergies: Allergies  Allergen Reactions   Amoxicillin Nausea Only    Stomach pain.   Latex Rash    Medications: Current Outpatient Medications  Medication Sig Dispense Refill   Calcium Carbonate-Vitamin D (CALCIUM 600+D PO) Take 1 tablet by mouth daily.     docusate sodium (COLACE) 100 MG capsule Take 100 mg by mouth at bedtime. 2 at bedtime     escitalopram (LEXAPRO) 10 MG tablet Take 5 mg by mouth at bedtime.     estradiol  (VIVELLE -DOT) 0.0375 MG/24HR Place 1 patch onto the skin 2 (two) times a week. 24 patch 3   fluconazole  (DIFLUCAN ) 150 MG tablet Take 1 now and 1 in 3 days 2 tablet 2   fluticasone (FLONASE) 50 MCG/ACT nasal spray Place 1 spray into both nostrils 2 (two) times daily as needed for allergies or rhinitis.     loratadine (CLARITIN) 10 MG tablet Take 10 mg by mouth daily.     Multiple Vitamins-Minerals (MULTIVITAMIN WITH MINERALS) tablet Take 1 tablet by mouth daily.     naratriptan (AMERGE) 2.5 MG tablet Take 2.5 mg by mouth as needed.     ondansetron  (ZOFRAN ) 4 MG tablet Take 1 tablet (4 mg total) by mouth every 8 (eight) hours as needed for nausea or vomiting. 30 tablet 1   polyethylene glycol (MIRALAX / GLYCOLAX) 17 g packet Take 17 g by mouth daily as needed for mild constipation.     progesterone  (PROMETRIUM ) 100 MG capsule TAKE 1 CAPSULE BY MOUTH EVERYDAY AT BEDTIME 90 capsule 4   Propylene Glycol (SYSTANE COMPLETE) 0.6  % SOLN Place 1 drop into both eyes in the morning and at bedtime.     triamcinolone cream (KENALOG) 0.1 % Apply 1 application. topically 3 (three) times daily as needed for rash.     UNABLE TO FIND Hydrocortisone  rectal cream- prn     vitamin B-12 (CYANOCOBALAMIN) 1000 MCG tablet Take 1,000 mcg by mouth daily.     Vonoprazan Fumarate  10 MG TABS Take 1 each by mouth daily. 90 tablet 1   No current facility-administered medications for this visit.    Review of Systems: GENERAL: negative for malaise, night sweats HEENT: No changes in hearing or vision, no nose bleeds or other nasal problems. NECK: Negative for lumps, goiter, pain and significant neck swelling RESPIRATORY: Negative for cough, wheezing CARDIOVASCULAR: Negative for chest pain, leg swelling, palpitations, orthopnea GI: SEE HPI MUSCULOSKELETAL: Negative for joint pain or swelling,  back pain, and muscle pain. SKIN: Negative for lesions, rash PSYCH: Negative for sleep disturbance, mood disorder and recent psychosocial stressors. HEMATOLOGY Negative for prolonged bleeding, bruising easily, and swollen nodes. ENDOCRINE: Negative for cold or heat intolerance, polyuria, polydipsia and goiter. NEURO: negative for tremor, gait imbalance, syncope and seizures. The remainder of the review of systems is noncontributory.   Physical Exam: BP 102/68 (BP Location: Left Arm, Patient Position: Sitting, Cuff Size: Normal)   Pulse 67   Temp 97.8 F (36.6 C) (Temporal)   Ht 5\' 3"  (1.6 m)   Wt 156 lb 1.6 oz (70.8 kg)   BMI 27.65 kg/m  GENERAL: The patient is AO x3, in no acute distress. HEENT: Head is normocephalic and atraumatic. EOMI are intact. Mouth is well hydrated and without lesions. NECK: Supple. No masses LUNGS: Clear to auscultation. No presence of rhonchi/wheezing/rales. Adequate chest expansion HEART: RRR, normal s1 and s2. ABDOMEN: Soft, nontender, no guarding, no peritoneal signs, and nondistended. BS +. No  masses. EXTREMITIES: Without any cyanosis, clubbing, rash, lesions or edema. NEUROLOGIC: AOx3, no focal motor deficit. SKIN: no jaundice, no rashes  Imaging/Labs: as above  I personally reviewed and interpreted the available labs, imaging and endoscopic files.  Impression and Plan: Toni Wolf is a 58 y.o. female with past medical history of GERD and anxiety, who presents for follow up of GERD and throat clearing.  Patient has presented longstanding history of GERD which has been treated with different PPIs, and most recently with request not with adequate response.  However she is still endorsing some throat clearing and cough episodes without typical symptoms of GERD, especially during spring season.  We had a very thorough discussion regarding the importance of confirming the presence of ongoing GERD while taking the acid suppression medication.  Due to this, we will evaluate this further with an EGD with Bravo placement on PPI, which she is agreeable to proceed with.  As it is possible that some of her symptoms are related to allergies, I encouraged her to follow closely her her PCP to assess the possibility of management with allergy medication.  -Schedule EGD Bravo on PPI with Dr. Alita Irwin -Continue Voquezna  10 mg every day -Consider discussing with PCP possible allergy medication for seasonal allergies  All questions were answered.      Samantha Cress, MD Gastroenterology and Hepatology [facility] Gastroenterology

## 2024-02-10 NOTE — Progress Notes (Unsigned)
 Katrinka Blazing, M.D. Gastroenterology & Hepatology [facility] Gastroenterology [address] [place], [place] [address]  Primary Care Physician: Richardean Chimera, MD [address] [place] [address]  I will communicate my assessment and recommendations to the referring MD via EMR.  Problems: GERD Hiatal hernia Throat clearing  History of Present Illness: Toni Wolf is a 58 y.o. female with past medical history of GERD and anxiety, who presents for follow up of GERD and throat clearing.  The patient was last seen on 09/14/2019 for. At that time, the patient was continued on requested 10 mg daily and advised to reconsider cTIF.  Patient reports that she is waking up with constant throat clearing recently, especially with the pollen from the spring season. She also reports that she has had some burning in her throat. When she eats food that has acid contents, she may have some heartburn.  However, her heartburn is not the most prevalent symptom but throat clearing and discomfort in the back of her throat.  She tried nexium 40 mg BID but her symptoms were worse, so now she is back on Voquezna 10 mg qday. She has presented some improvement with Voquezna versus Nexium.  Patient states that when she felt her symptoms were worse was when she had a respiratory infection and that is why she asked to be switched to the Nexium but her heartburn improved after this.  She also reports that she has also presented some bloating issues which lasted for 3 days. She took Miralax and it improved. This usually happens after having a meal. This has been ongoing for a week.  Denies having any nausea.  The patient denies having any nausea, vomiting, fever, chills, hematochezia, melena, hematemesis, abdominal pain, diarrhea, jaundice, pruritus or weight loss.  Last Colonoscopy:03/13/21 One 3 mm polyp in the descending colon-tubular adenoma - One 6 mm polyp in the sigmoid  colon-tubular adenoma - External hemorrhoids. - Anal papilla(e) were hypertrophied Last Endoscopy:- 02/12/22 Normal hypopharynx. - Normal esophagus. - Z-line irregular, 33 cm from the incisors. Biopsied (Gastroesophageal mucosa with mild inflammation consistent with reflux) - 3 cm hiatal hernia. - Gastritis. Biopsied. - Normal duodenal bulb and second portion of the duodenum.  Past Medical History: Past Medical History:  Diagnosis Date   Anxiety    ASCUS of cervix with negative high risk HPV 04/23/2021   04/23/21 repeat pap in 3 years per [facility]   GERD (gastroesophageal reflux disease)    Hiatal hernia    Seasonal allergies     Past Surgical History: Past Surgical History:  Procedure Laterality Date   BIOPSY  03/13/2021   Procedure: BIOPSY;  Surgeon: Malissa Hippo, MD;  [facility];  Service: Endoscopy;;  descending colon polyp   BIOPSY  02/12/2022   Procedure: BIOPSY;  Surgeon: Malissa Hippo, MD;  [facility];  Service: Endoscopy;;   CHOLECYSTECTOMY     COLONOSCOPY N/A 01/23/2016   Procedure: COLONOSCOPY;  Surgeon: Malissa Hippo, MD;  [facility];  Service: Endoscopy;  Laterality: N/A;  830   COLONOSCOPY N/A 03/13/2021   Rehman: 3mm tubular adenoma in descending colon, 6mm tubular adenoma in sigmoid colon, external hemorrhoids, anal papillae hypertrophied   ESOPHAGOGASTRODUODENOSCOPY (EGD) WITH PROPOFOL N/A 02/12/2022   Procedure: ESOPHAGOGASTRODUODENOSCOPY (EGD) WITH PROPOFOL;  Surgeon: Malissa Hippo, MD;  [facility];  Service: Endoscopy;  Laterality: N/A;  115   LAPAROSCOPY     NASAL SEPTUM SURGERY  POLYPECTOMY  03/13/2021   Procedure: POLYPECTOMY;  Surgeon: Malissa Hippo, MD;  [facility];  Service: Endoscopy;;  sigmoid colon polyp    Family History: Family History  Problem Relation Age of Onset   Crohn's disease Mother    Cirrhosis Mother    Diabetes Father    Heart disease Father    COPD Father     Hypertension Father    Arthritis Sister    Heart disease Maternal Grandmother    Diabetes Maternal Grandmother    Arthritis Maternal Grandmother    Heart disease Paternal Grandfather    Diabetes Paternal Grandfather    Colon cancer Neg Hx     Social History: Social History   Tobacco Use  Smoking Status Never   Passive exposure: Never  Smokeless Tobacco Never   Social History   Substance and Sexual Activity  Alcohol Use Yes   Comment: 1 glass of wine  a year   Social History   Substance and Sexual Activity  Drug Use No    Allergies: Allergies  Allergen Reactions   Amoxicillin Nausea Only    Stomach pain.   Latex Rash    Medications: Current Outpatient Medications  Medication Sig Dispense Refill   Calcium Carbonate-Vitamin D (CALCIUM 600+D PO) Take 1 tablet by mouth daily.     docusate sodium (COLACE) 100 MG capsule Take 100 mg by mouth at bedtime. 2 at bedtime     escitalopram (LEXAPRO) 10 MG tablet Take 5 mg by mouth at bedtime.     estradiol (VIVELLE-DOT) 0.0375 MG/24HR Place 1 patch onto the skin 2 (two) times a week. 24 patch 3   fluconazole (DIFLUCAN) 150 MG tablet Take 1 now and 1 in 3 days 2 tablet 2   fluticasone (FLONASE) 50 MCG/ACT nasal spray Place 1 spray into both nostrils 2 (two) times daily as needed for allergies or rhinitis.     loratadine (CLARITIN) 10 MG tablet Take 10 mg by mouth daily.     Multiple Vitamins-Minerals (MULTIVITAMIN WITH MINERALS) tablet Take 1 tablet by mouth daily.     naratriptan (AMERGE) 2.5 MG tablet Take 2.5 mg by mouth as needed.     ondansetron (ZOFRAN) 4 MG tablet Take 1 tablet (4 mg total) by mouth every 8 (eight) hours as needed for nausea or vomiting. 30 tablet 1   polyethylene glycol (MIRALAX / GLYCOLAX) 17 g packet Take 17 g by mouth daily as needed for mild constipation.     progesterone (PROMETRIUM) 100 MG capsule TAKE 1 CAPSULE BY MOUTH EVERYDAY AT BEDTIME 90 capsule 4   Propylene Glycol (SYSTANE COMPLETE) 0.6  % SOLN Place 1 drop into both eyes in the morning and at bedtime.     triamcinolone cream (KENALOG) 0.1 % Apply 1 application. topically 3 (three) times daily as needed for rash.     UNABLE TO FIND Hydrocortisone rectal cream- prn     vitamin B-12 (CYANOCOBALAMIN) 1000 MCG tablet Take 1,000 mcg by mouth daily.     Vonoprazan Fumarate 10 MG TABS Take 1 each by mouth daily. 90 tablet 1   No current facility-administered medications for this visit.    Review of Systems: GENERAL: negative for malaise, night sweats HEENT: No changes in hearing or vision, no nose bleeds or other nasal problems. NECK: Negative for lumps, goiter, pain and significant neck swelling RESPIRATORY: Negative for cough, wheezing CARDIOVASCULAR: Negative for chest pain, leg swelling, palpitations, orthopnea GI: SEE HPI MUSCULOSKELETAL: Negative for joint pain or swelling,  back pain, and muscle pain. SKIN: Negative for lesions, rash PSYCH: Negative for sleep disturbance, mood disorder and recent psychosocial stressors. HEMATOLOGY Negative for prolonged bleeding, bruising easily, and swollen nodes. ENDOCRINE: Negative for cold or heat intolerance, polyuria, polydipsia and goiter. NEURO: negative for tremor, gait imbalance, syncope and seizures. The remainder of the review of systems is noncontributory.   Physical Exam: BP 102/68 (BP Location: Left Arm, Patient Position: Sitting, Cuff Size: Normal)   Pulse 67   Temp 97.8 F (36.6 C) (Temporal)   Ht 5\' 3"  (1.6 m)   Wt 156 lb 1.6 oz (70.8 kg)   BMI 27.65 kg/m  GENERAL: The patient is AO x3, in no acute distress. HEENT: Head is normocephalic and atraumatic. EOMI are intact. Mouth is well hydrated and without lesions. NECK: Supple. No masses LUNGS: Clear to auscultation. No presence of rhonchi/wheezing/rales. Adequate chest expansion HEART: RRR, normal s1 and s2. ABDOMEN: Soft, nontender, no guarding, no peritoneal signs, and nondistended. BS +. No  masses. EXTREMITIES: Without any cyanosis, clubbing, rash, lesions or edema. NEUROLOGIC: AOx3, no focal motor deficit. SKIN: no jaundice, no rashes  Imaging/Labs: as above  I personally reviewed and interpreted the available labs, imaging and endoscopic files.  Impression and Plan: Toni Wolf is a 58 y.o. female with past medical history of GERD and anxiety, who presents for follow up of GERD and throat clearing.  Patient has presented longstanding history of GERD which has been treated with different PPIs, and most recently with request not with adequate response.  However she is still endorsing some throat clearing and cough episodes without typical symptoms of GERD, especially during spring season.  We had a very thorough discussion regarding the importance of confirming the presence of ongoing GERD while taking the acid suppression medication.  Due to this, we will evaluate this further with an EGD with Bravo placement on PPI, which she is agreeable to proceed with.  As it is possible that some of her symptoms are related to allergies, I encouraged her to follow closely her her PCP to assess the possibility of management with allergy medication.  -Schedule EGD Bravo on PPI with Dr. Tasia Catchings -Continue Voquezna 10 mg every day -Consider discussing with PCP possible allergy medication for seasonal allergies  All questions were answered.      Katrinka Blazing, MD Gastroenterology and Hepatology [facility] Gastroenterology

## 2024-02-11 ENCOUNTER — Telehealth: Payer: Self-pay | Admitting: *Deleted

## 2024-02-11 NOTE — Telephone Encounter (Signed)
 Voquezna 10 mg was added to pt's med list. JSY

## 2024-02-14 ENCOUNTER — Other Ambulatory Visit: Payer: Self-pay | Admitting: Adult Health

## 2024-02-14 LAB — CYTOLOGY - PAP
Adequacy: ABSENT
Comment: NEGATIVE
Diagnosis: NEGATIVE
High risk HPV: NEGATIVE

## 2024-02-14 MED ORDER — FLUCONAZOLE 150 MG PO TABS: ORAL_TABLET | ORAL | 2 refills | Status: DC

## 2024-02-23 ENCOUNTER — Telehealth (INDEPENDENT_AMBULATORY_CARE_PROVIDER_SITE_OTHER): Payer: Self-pay | Admitting: Gastroenterology

## 2024-02-23 NOTE — Telephone Encounter (Signed)
 Left message for pt to return call to schedule EGD w/Bravo. I have held a place for 03/22/24 at 8:15am.

## 2024-03-04 ENCOUNTER — Encounter (INDEPENDENT_AMBULATORY_CARE_PROVIDER_SITE_OTHER): Payer: Self-pay | Admitting: Gastroenterology

## 2024-03-06 ENCOUNTER — Telehealth (INDEPENDENT_AMBULATORY_CARE_PROVIDER_SITE_OTHER): Payer: Self-pay | Admitting: *Deleted

## 2024-03-06 NOTE — Telephone Encounter (Signed)
 Patient left a vm about procedure for May 1st. She said she was told that day was available and she has taken off that day and wanted to know if she will be able to be scheduled for may 1st. I called her back and left on her voicemail that you were out to day but I would send  you a message.  [phone]

## 2024-03-06 NOTE — Telephone Encounter (Signed)
 Thank you Dr Trenton Frock looks like she would like to have Thursdays , she can be scheduled on Any Room ,

## 2024-03-07 NOTE — Telephone Encounter (Signed)
 May 1 is no longer available for EGD w/Bravo. Sent my chart message to pt and left voicemail for pt to return call or reply via my chart.

## 2024-03-07 NOTE — Telephone Encounter (Signed)
 Cancellation for 03/09/24 at 9:00am. Pt placed on schedule. Left message to return call. Will send my chart message. Availity states no auth required for either procedure.

## 2024-03-07 NOTE — Telephone Encounter (Signed)
 [facility]; please see 02/23/24 telephone message also

## 2024-03-08 ENCOUNTER — Encounter (HOSPITAL_COMMUNITY)
Admission: RE | Admit: 2024-03-08 | Discharge: 2024-03-08 | Disposition: A | Discharge status: Home / Self Care | Source: Ambulatory Visit | Attending: Gastroenterology | Admitting: Gastroenterology

## 2024-03-08 ENCOUNTER — Encounter (HOSPITAL_COMMUNITY): Payer: Self-pay

## 2024-03-08 NOTE — Anesthesia Preprocedure Evaluation (Addendum)
 Anesthesia Evaluation  Patient identified by MRN, date of birth, ID band Patient awake    Reviewed: Allergy & Precautions, NPO status , Patient's Chart, lab work & pertinent test results  Airway Mallampati: II  TM Distance: >3 FB Neck ROM: Full    Dental no notable dental hx. (+) Dental Advisory Given, Teeth Intact Bridge and crowns:   Pulmonary neg pneumonia    Pulmonary exam normal breath sounds clear to auscultation       Cardiovascular negative cardio ROS Normal cardiovascular exam Rhythm:Regular Rate:Normal     Neuro/Psych   Anxiety      negative psych ROS   GI/Hepatic Neg liver ROS, hiatal hernia,GERD  Medicated and Poorly Controlled,,  Endo/Other  negative endocrine ROS    Renal/GU negative Renal ROS  negative genitourinary   Musculoskeletal negative musculoskeletal ROS (+)    Abdominal   Peds negative pediatric ROS (+)  Hematology negative hematology ROS (+)   Anesthesia Other Findings H/o Covid infection - mostly GI symptoms and SOB on exertion  Reproductive/Obstetrics negative OB ROS                             Anesthesia Physical Anesthesia Plan  ASA: 2  Anesthesia Plan: General   Post-op Pain Management: Minimal or no pain anticipated   Induction: Intravenous  PONV Risk Score and Plan: Propofol  infusion  Airway Management Planned: Nasal Cannula and Natural Airway  Additional Equipment: None  Intra-op Plan:   Post-operative Plan:   Informed Consent: I have reviewed the patients History and Physical, chart, labs and discussed the procedure including the risks, benefits and alternatives for the proposed anesthesia with the patient or authorized representative who has indicated his/her understanding and acceptance.     Dental advisory given  Plan Discussed with: CRNA  Anesthesia Plan Comments:         Anesthesia Quick Evaluation

## 2024-03-09 ENCOUNTER — Encounter (HOSPITAL_COMMUNITY): Payer: Self-pay | Admitting: Gastroenterology

## 2024-03-09 ENCOUNTER — Ambulatory Visit (HOSPITAL_COMMUNITY)
Admission: RE | Admit: 2024-03-09 | Discharge: 2024-03-09 | Disposition: A | Discharge status: Home / Self Care | Attending: Gastroenterology | Admitting: Gastroenterology

## 2024-03-09 ENCOUNTER — Ambulatory Visit (HOSPITAL_BASED_OUTPATIENT_CLINIC_OR_DEPARTMENT_OTHER): Payer: Self-pay | Admitting: Anesthesiology

## 2024-03-09 ENCOUNTER — Encounter (HOSPITAL_COMMUNITY)
Admission: RE | Disposition: A | Discharge status: Home / Self Care | Payer: Self-pay | Source: Home / Self Care | Attending: Gastroenterology

## 2024-03-09 ENCOUNTER — Ambulatory Visit (HOSPITAL_COMMUNITY): Payer: Self-pay | Admitting: Anesthesiology

## 2024-03-09 ENCOUNTER — Encounter (INDEPENDENT_AMBULATORY_CARE_PROVIDER_SITE_OTHER): Payer: Self-pay

## 2024-03-09 DIAGNOSIS — K449 Diaphragmatic hernia without obstruction or gangrene: Secondary | ICD-10-CM | POA: Insufficient documentation

## 2024-03-09 DIAGNOSIS — R09A2 Foreign body sensation, throat: Secondary | ICD-10-CM

## 2024-03-09 DIAGNOSIS — K3189 Other diseases of stomach and duodenum: Secondary | ICD-10-CM | POA: Diagnosis not present

## 2024-03-09 DIAGNOSIS — R1310 Dysphagia, unspecified: Secondary | ICD-10-CM

## 2024-03-09 DIAGNOSIS — R0989 Other specified symptoms and signs involving the circulatory and respiratory systems: Secondary | ICD-10-CM

## 2024-03-09 DIAGNOSIS — K2970 Gastritis, unspecified, without bleeding: Secondary | ICD-10-CM

## 2024-03-09 DIAGNOSIS — K219 Gastro-esophageal reflux disease without esophagitis: Secondary | ICD-10-CM | POA: Insufficient documentation

## 2024-03-09 HISTORY — PX: BRAVO PH STUDY: SHX5421

## 2024-03-09 HISTORY — PX: ESOPHAGOGASTRODUODENOSCOPY: SHX5428

## 2024-03-09 SURGERY — EGD (ESOPHAGOGASTRODUODENOSCOPY)
Anesthesia: General

## 2024-03-09 MED ORDER — PHENYLEPHRINE 80 MCG/ML (10ML) SYRINGE FOR IV PUSH (FOR BLOOD PRESSURE SUPPORT)
PREFILLED_SYRINGE | INTRAVENOUS | Status: DC | PRN
  Administered 2024-03-09: 160 ug via INTRAVENOUS
  Administered 2024-03-09: 80 ug via INTRAVENOUS

## 2024-03-09 MED ORDER — GLYCOPYRROLATE PF 0.2 MG/ML IJ SOSY
PREFILLED_SYRINGE | INTRAMUSCULAR | Status: DC | PRN
  Administered 2024-03-09 (×2): .1 mg via INTRAVENOUS

## 2024-03-09 MED ORDER — GLYCOPYRROLATE PF 0.2 MG/ML IJ SOSY
PREFILLED_SYRINGE | INTRAMUSCULAR | Status: AC
Start: 2024-03-09 — End: ?
  Filled 2024-03-09: qty 1

## 2024-03-09 MED ORDER — PROPOFOL 500 MG/50ML IV EMUL
INTRAVENOUS | Status: DC | PRN
Start: 2024-03-09 — End: 2024-03-09
  Administered 2024-03-09: 150 ug/kg/min via INTRAVENOUS

## 2024-03-09 MED ORDER — PROPOFOL 1000 MG/100ML IV EMUL
INTRAVENOUS | Status: AC
Start: 2024-03-09 — End: ?
  Filled 2024-03-09: qty 100

## 2024-03-09 MED ORDER — PHENYLEPHRINE 80 MCG/ML (10ML) SYRINGE FOR IV PUSH (FOR BLOOD PRESSURE SUPPORT)
PREFILLED_SYRINGE | INTRAVENOUS | Status: AC
  Filled 2024-03-09: qty 10

## 2024-03-09 MED ORDER — LIDOCAINE 2% (20 MG/ML) 5 ML SYRINGE
INTRAMUSCULAR | Status: DC | PRN
Start: 2024-03-09 — End: 2024-03-09
  Administered 2024-03-09: 60 mg via INTRAVENOUS
  Administered 2024-03-09: 40 mg via INTRAVENOUS

## 2024-03-09 MED ORDER — LACTATED RINGERS IV SOLN: INTRAVENOUS | Status: DC | PRN

## 2024-03-09 MED ORDER — PROPOFOL 10 MG/ML IV BOLUS
INTRAVENOUS | Status: DC | PRN
  Administered 2024-03-09: 30 mg via INTRAVENOUS
  Administered 2024-03-09: 80 mg via INTRAVENOUS
  Administered 2024-03-09: 50 mg via INTRAVENOUS
  Administered 2024-03-09: 30 mg via INTRAVENOUS

## 2024-03-09 MED ORDER — LIDOCAINE 2% (20 MG/ML) 5 ML SYRINGE
INTRAMUSCULAR | Status: AC
  Filled 2024-03-09: qty 5

## 2024-03-09 NOTE — Telephone Encounter (Signed)
 Hi [name]  Please continue taking all medication especially Vonoprazan . Something after BRAVO placement chest discomfort is normal . Let me call you in a bit  Thanks Dr Alita Irwin

## 2024-03-09 NOTE — Telephone Encounter (Signed)
 My chart message sent to pt.

## 2024-03-09 NOTE — Anesthesia Postprocedure Evaluation (Signed)
 Anesthesia Post Note  Patient: Toni Wolf  Procedure(s) Performed: EGD (ESOPHAGOGASTRODUODENOSCOPY) PH MONITORING, ESOPHAGUS, WIRELESS  Patient location during evaluation: Phase II Anesthesia Type: General Level of consciousness: awake and alert Pain management: pain level controlled Vital Signs Assessment: post-procedure vital signs reviewed and stable Respiratory status: spontaneous breathing, nonlabored ventilation, respiratory function stable and patient connected to nasal cannula oxygen Cardiovascular status: blood pressure returned to baseline and stable Postop Assessment: no apparent nausea or vomiting Anesthetic complications: no   There were no known notable events for this encounter.   Last Vitals:  Vitals:   03/09/24 0859 03/09/24 0901  BP: (!) 89/44 90/63  Pulse: 65   Resp: 15   Temp: 36.6 C   SpO2: 99%     Last Pain:  Vitals:   03/09/24 0901  TempSrc:   PainSc: 0-No pain                 Letita Prentiss L Geoffry Bannister

## 2024-03-09 NOTE — Discharge Instructions (Addendum)
  Discharge instructions Please read the instructions outlined below and refer to this sheet in the next few weeks. These discharge instructions provide you with general information on caring for yourself after you leave the hospital. Your doctor may also give you specific instructions. While your treatment has been planned according to the most current medical practices available, unavoidable complications occasionally occur. If you have any problems or questions after discharge, please call your doctor. ACTIVITY You may resume your regular activity but move at a slower pace for the next 24 hours.  Take frequent rest periods for the next 24 hours.  Walking will help expel (get rid of) the air and reduce the bloated feeling in your abdomen.  No driving for 24 hours (because of the anesthesia (medicine) used during the test).  You may shower.  Do not sign any important legal documents or operate any machinery for 24 hours (because of the anesthesia used during the test).  NUTRITION Drink plenty of fluids.  You may resume your normal diet.  Begin with a light meal and progress to your normal diet.  Avoid alcoholic beverages for 24 hours or as instructed by your caregiver.  MEDICATIONS You may resume your normal medications unless your caregiver tells you otherwise.  WHAT YOU CAN EXPECT TODAY You may experience abdominal discomfort such as a feeling of fullness or "gas" pains.  FOLLOW-UP Your doctor will discuss the results of your test with you.  SEEK IMMEDIATE MEDICAL ATTENTION IF ANY OF THE FOLLOWING OCCUR: Excessive nausea (feeling sick to your stomach) and/or vomiting.  Severe abdominal pain and distention (swelling).  Trouble swallowing.  Temperature over 101 F (37.8 C).  Rectal bleeding or vomiting of blood.    Continue your medications including Vonoprazan   I hope you have a great rest of your week!   Arjun Hard Faizan Suhail Peloquin , M.D.. Gastroenterology and Hepatology [facility]  [facility]

## 2024-03-09 NOTE — Interval H&P Note (Signed)
 History and Physical Interval Note:  03/09/2024 7:29 AM  Toni Wolf  has presented today for surgery, with the diagnosis of GERD;THROAT CLEARING.  The various methods of treatment have been discussed with the patient and family. After consideration of risks, benefits and other options for treatment, the patient has consented to  Procedure(s) with comments: EGD (ESOPHAGOGASTRODUODENOSCOPY) (N/A) - 9:00AM;ASA 1-2, pt knows to arrive at 6:30 PH MONITORING, ESOPHAGUS, WIRELESS (N/A) - 9:00AM;ASA 1-2 as a surgical intervention.  The patient's history has been reviewed, patient examined, no change in status, stable for surgery.  I have reviewed the patient's chart and labs.  Questions were answered to the patient's satisfaction.     Patient also complaints of occasional dysphagia . Confirmed that she is taking PCAB and will continue it . Proceed with EGD with BRAVO/biopsies and dilation   Hargis Lias

## 2024-03-09 NOTE — Op Note (Signed)
 [facility] Patient Name: Toni Wolf Procedure Date: 03/09/2024 8:17 AM MRN: [id number] Date of Birth: Mar 07, 1966 Attending MD: Terril Fetters , MD, [id number] CSN: [id number] Age: 58 Admit Type: Outpatient Procedure:                Upper GI endoscopy Indications:              Dysphagia, Suspected non-erosive esophageal reflux,                            Esophageal reflux symptoms that persist despite                            appropriate therapy, Chest pain (non cardiac),                            Globus sensation Providers:                Terril Fetters, MD, Graydon Lazier RN, RN, Jolee Naval, Technician Referring MD:              Medicines:                Monitored Anesthesia Care Complications:            No immediate complications. Estimated Blood Loss:     Estimated blood loss was minimal. Procedure:                Pre-Anesthesia Assessment:                           - Prior to the procedure, a History and Physical                            was performed, and patient medications and                            allergies were reviewed. The patient's tolerance of                            previous anesthesia was also reviewed. The risks                            and benefits of the procedure and the sedation                            options and risks were discussed with the patient.                            All questions were answered, and informed consent                            was obtained. Prior Anticoagulants: The patient has                            taken no  anticoagulant or antiplatelet agents. ASA                            Grade Assessment: II - A patient with mild systemic                            disease. After reviewing the risks and benefits,                            the patient was deemed in satisfactory condition to                            undergo the procedure.                           After obtaining informed  consent, the endoscope was                            passed under direct vision. Throughout the                            procedure, the patient's blood pressure, pulse, and                            oxygen saturations were monitored continuously. The                            [id number]-H190 ([id number]) scope was introduced through the                            mouth, and advanced to the second part of duodenum.                            The upper GI endoscopy was accomplished without                            difficulty. The patient tolerated the procedure                            well. Scope In: 8:39:42 AM Scope Out: 8:51:19 AM Total Procedure Duration: 0 hours 11 minutes 37 seconds  Findings:      No endoscopic abnormality was evident in the esophagus to explain the       patient's complaint of dysphagia. It was decided, however, to proceed       with dilation of the entire esophagus. A guidewire was placed and the       scope was withdrawn. Dilation was performed with a Savary dilator with       mild resistance at 18 mm. The dilation site was examined following       endoscope reinsertion and showed mild mucosal disruption. Biopsies were       obtained from the proximal and distal esophagus with cold forceps for       histology of suspected eosinophilic esophagitis. The BRAVO capsule with       delivery system was introduced through  the mouth and advanced into the       esophagus, such that the BRAVO pH capsule was positioned 29 cm from the       incisors, which was 6 cm proximal to the GE junction. The BRAVO pH       capsule was then deployed and attached to the esophageal mucosa. The       delivery system was then withdrawn. Endoscopy was utilized for probe       placement and diagnostic evaluation.      Esophagogastric landmarks were identified: the Z-line was found at 35 cm       and the gastroesophageal junction was found at 36 cm from the incisors.      A 1 cm hiatal hernia  was present.      The gastroesophageal flap valve was visualized endoscopically and       classified as Hill Grade II (fold present, opens with respiration).      Mildly erythematous mucosa without bleeding was found in the stomach.       Biopsies were taken with a cold forceps for histology.      The duodenal bulb and second portion of the duodenum were normal. Impression:               - No endoscopic esophageal abnormality to explain                            patient's dysphagia. Esophagus dilated. Dilated.                           - Esophagogastric landmarks identified.                           - 1 cm hiatal hernia.                           - Gastroesophageal flap valve classified as Hill                            Grade II (fold present, opens with respiration).                           - Erythematous mucosa in the stomach. Biopsied.                           - Normal duodenal bulb and second portion of the                            duodenum.                           - Biopsies were taken with a cold forceps for                            evaluation of eosinophilic esophagitis.                           - The BRAVO pH capsule was deployed. Moderate Sedation:      Per Anesthesia Care Recommendation:           -  Patient has a contact number available for                            emergencies. The signs and symptoms of potential                            delayed complications were discussed with the                            patient. Return to normal activities tomorrow.                            Written discharge instructions were provided to the                            patient.                           - Resume previous diet.                           - Continue present medications.                           - Await pathology results.                           -BRAVO instruction (ON PCAB) Procedure Code(s):        --- Professional ---                           [id number],  Esophagogastroduodenoscopy, flexible,                            transoral; with insertion of guide wire followed by                            passage of dilator(s) through esophagus over guide                            wire                           43239, 59, Esophagogastroduodenoscopy, flexible,                            transoral; with biopsy, single or multiple Diagnosis Code(s):        --- Professional ---                           R13.10, Dysphagia, unspecified                           K44.9, Diaphragmatic hernia without obstruction or                            gangrene  K31.89, Other diseases of stomach and duodenum                           K21.9, Gastro-esophageal reflux disease without                            esophagitis                           R07.89, Other chest pain                           F45.8, Other somatoform disorders CPT copyright 2022 [employer]. All rights reserved. The codes documented in this report are preliminary and upon coder review may  be revised to meet current compliance requirements. Terril Fetters, MD Terril Fetters, MD 03/09/2024 9:00:05 AM This report has been signed electronically. Number of Addenda: 0

## 2024-03-09 NOTE — Transfer of Care (Addendum)
 Immediate Anesthesia Transfer of Care Note  Patient: Toni Wolf  Procedure(s) Performed: EGD (ESOPHAGOGASTRODUODENOSCOPY) PH MONITORING, ESOPHAGUS, WIRELESS  Patient Location: Short Stay  Anesthesia Type:General  Level of Consciousness: drowsy and patient cooperative  Airway & Oxygen Therapy: Patient Spontanous Breathing  Post-op Assessment: Report given to RN and Post -op Vital signs reviewed and stable  Post vital signs: Reviewed and stable  Last Vitals:  Vitals Value Taken Time  BP 90/63 03/09/24   0901  Temp 36.6 03/09/24   0859  Pulse 64 03/09/24   0859  Resp 15 03/09/24   0859  SpO2 99% 03/09/24   0859    Last Pain:  Vitals:   03/09/24 0834  PainSc: 0-No pain         Complications: No notable events documented.

## 2024-03-10 LAB — SURGICAL PATHOLOGY

## 2024-03-13 ENCOUNTER — Telehealth (INDEPENDENT_AMBULATORY_CARE_PROVIDER_SITE_OTHER): Payer: Self-pay | Admitting: Gastroenterology

## 2024-03-13 ENCOUNTER — Encounter (HOSPITAL_COMMUNITY): Payer: Self-pay | Admitting: Gastroenterology

## 2024-03-13 ENCOUNTER — Encounter (INDEPENDENT_AMBULATORY_CARE_PROVIDER_SITE_OTHER): Payer: Self-pay

## 2024-03-13 ENCOUNTER — Ambulatory Visit (HOSPITAL_COMMUNITY)
Admission: RE | Admit: 2024-03-13 | Discharge: 2024-03-13 | Disposition: A | Discharge status: Home / Self Care | Source: Ambulatory Visit | Attending: Gastroenterology | Admitting: Gastroenterology

## 2024-03-13 DIAGNOSIS — R0789 Other chest pain: Secondary | ICD-10-CM | POA: Diagnosis present

## 2024-03-13 NOTE — Telephone Encounter (Signed)
 Chest xray ordered. Attempted to reach pt but voicemail is full unable to leave message. Did send my chart message to pt .

## 2024-03-13 NOTE — Telephone Encounter (Signed)
 this patient just called and had a bravo last week and she has been having pain in the area that she believes the pill is attached since Thursday but the pain is more intense today.  She was going to call your office later and let you know but I told her I would also try to reach you in case you have anything for me to tell her to do for now  Please advise. Thank you (Dr.Ahmed is out of the office)

## 2024-03-13 NOTE — Telephone Encounter (Signed)
 Tanya:  Let's get CXR today PA/lateral (will need to be stat so it can be read in timely manner).   Please have her resume [employer]  daily if she hasn't already.   I doubt the pill is causing any issues and likely passed already.   I discussed with Dr. Sammi Crick a well, who is patient's primary GI.

## 2024-03-13 NOTE — Addendum Note (Signed)
 Addended by: Sylvie Mifsud on: 03/13/2024 10:18 AM   Modules accepted: Orders

## 2024-03-13 NOTE — Telephone Encounter (Signed)
 Pt spouse contacted and made aware of xray ordered. Spouse states that pt is at work today. Advised husband that we have ordered a chest xray and we are needing the pt to go to [facility] to have that xray done. Pt husband verbalized understanding

## 2024-03-17 ENCOUNTER — Other Ambulatory Visit: Payer: Self-pay | Admitting: Gastroenterology

## 2024-03-17 DIAGNOSIS — K219 Gastro-esophageal reflux disease without esophagitis: Secondary | ICD-10-CM

## 2024-03-17 DIAGNOSIS — K449 Diaphragmatic hernia without obstruction or gangrene: Secondary | ICD-10-CM

## 2024-03-17 MED ORDER — VOQUEZNA 10 MG PO TABS: 10.0000 mg | ORAL_TABLET | Freq: Every day | ORAL | 1 refills | Status: DC

## 2024-03-23 ENCOUNTER — Other Ambulatory Visit (HOSPITAL_COMMUNITY): Payer: Self-pay | Admitting: Adult Health

## 2024-03-23 DIAGNOSIS — Z1231 Encounter for screening mammogram for malignant neoplasm of breast: Secondary | ICD-10-CM

## 2024-03-23 NOTE — Procedures (Signed)
 BRAVO and EGD with biopsies report   Pathology report :  A. STOMACH, BIOPSY:  - Mild reactive gastropathy.  - Negative for H. pylori on HE stain  - No intestinal metaplasia, dysplasia, or malignancy.   B. ESOPHAGUS, BIOPSY:  - Squamous mucosa with mild reflux changes  - Negative for increased intraepithelial eosinophils  - Negative for dysplasia or malignancy   BRAVO Report     Procedure Description  The study was performed with the BRAVO pH capsule telemetry system. A routine upper endoscopy was performed and then BRAVO wireless pH telemetry probe was placed under endoscopic guidance 6 cm proximal to the Z line. The patient was then discharged home and kept a symptom diary for 96HRs, afterwhich the wireless telemetry receiver and diary were brought back to the endoscopy unit. The information was then downloaded and analyzed.     Indications  Refractory GERD , Globus sensation, Constant throat clearing     Interpretations    This was a normal study performed ON acid suppressive therapy. There was no pathological increased esophageal acid exposure noted on Day 1 (0.4%),  Day 2 (0.1%), Day 4 (0.1%) but elevated on Day 3 (12.1%) . The DeMeester Score was elevated at 18.3 (>14.7) for all days combined. Total AET is 3.1% for all days combined which is physiological . There was no symptom correlation for symptoms of heartburn and patient symptoms with total of 15 occurrences reported ( SI:16 and SAP:80.4). This suggest that patient symptoms are not due to any pathological acid mediated GERD.  Clinical correlation is recommended.   There might be a component of esophageal hypersentivity as well as patient was very symptomatic with chest pain after BRAVO placement    Recommendation  Patient may benefit from neuromodulator if symptoms persist despite acid suppression therapy and possible allergy medications for seasonal allergies  Attempted to call patient and husband number few times  today. HIPAA complaint VM was left   Patient to follow up in the GI clinic .

## 2024-03-24 ENCOUNTER — Ambulatory Visit (INDEPENDENT_AMBULATORY_CARE_PROVIDER_SITE_OTHER): Payer: Self-pay | Admitting: Gastroenterology

## 2024-03-30 ENCOUNTER — Encounter (INDEPENDENT_AMBULATORY_CARE_PROVIDER_SITE_OTHER): Payer: Self-pay | Admitting: Gastroenterology

## 2024-03-30 ENCOUNTER — Other Ambulatory Visit (INDEPENDENT_AMBULATORY_CARE_PROVIDER_SITE_OTHER): Payer: Self-pay | Admitting: Gastroenterology

## 2024-03-30 DIAGNOSIS — R1112 Projectile vomiting: Secondary | ICD-10-CM

## 2024-04-04 ENCOUNTER — Ambulatory Visit (HOSPITAL_COMMUNITY)
Admission: RE | Admit: 2024-04-04 | Discharge: 2024-04-04 | Disposition: A | Discharge status: Home / Self Care | Source: Ambulatory Visit | Attending: Gastroenterology | Admitting: Gastroenterology

## 2024-04-04 DIAGNOSIS — R1112 Projectile vomiting: Secondary | ICD-10-CM | POA: Insufficient documentation

## 2024-04-10 ENCOUNTER — Ambulatory Visit (INDEPENDENT_AMBULATORY_CARE_PROVIDER_SITE_OTHER): Payer: Self-pay | Admitting: Gastroenterology

## 2024-04-17 ENCOUNTER — Encounter (INDEPENDENT_AMBULATORY_CARE_PROVIDER_SITE_OTHER): Payer: Self-pay | Admitting: Gastroenterology

## 2024-04-17 ENCOUNTER — Ambulatory Visit (INDEPENDENT_AMBULATORY_CARE_PROVIDER_SITE_OTHER): Admitting: Gastroenterology

## 2024-04-17 VITALS — BP 119/61 | HR 69 | Temp 97.8°F | Ht 63.0 in | Wt 155.5 lb

## 2024-04-17 DIAGNOSIS — K219 Gastro-esophageal reflux disease without esophagitis: Secondary | ICD-10-CM | POA: Diagnosis not present

## 2024-04-17 DIAGNOSIS — R0989 Other specified symptoms and signs involving the circulatory and respiratory systems: Secondary | ICD-10-CM

## 2024-04-17 DIAGNOSIS — K5900 Constipation, unspecified: Secondary | ICD-10-CM | POA: Insufficient documentation

## 2024-04-17 NOTE — Progress Notes (Unsigned)
 Toni Wolf, M.D. Gastroenterology & Hepatology [facility] [address] [place], [place] [address] Primary Care Physician: Leesa Pulling, MD [address] [place] [address]  Problems: GERD Hiatal hernia Throat clearing   History of Present Illness: Toni Wolf is a 58 y.o. female with past medical history of GERD and anxiety, who presents for follow up of GERD and throat clearing.  Last seen in clinic on 02/10/2024.  Was going for EGD with Bravo placement and was continued on Voquezna .  Patient reports feeling very significant improvement of her symptoms after she had her last EGD. No dysphagia. States that she has some . Has not had any "gurgling in her throat". She has been able to eat more stuff and denies any dysphagia. Tries to avoid pizza and salsa as much as possible. Feels her symptoms are possibly 80% better. She is not having too much heartburn but she clear her throat frequently. Sometimes can have some upper abdominal pain.  She is on Voquezna  10 mg qday.  The patient denies having any nausea, vomiting, fever, chills, hematochezia, melena, hematemesis, abdominal distention, abdominal pain, diarrhea, jaundice, pruritus or weight loss.  Is taking 1 capful Miralax daily and now she is having a BM every day.  96h Bravo test 03/09/24: This was a normal study performed ON acid suppressive therapy. There was no pathological increased esophageal acid exposure noted on Day 1 (0.4%),  Day 2 (0.1%), Day 4 (0.1%) but elevated on Day 3 (12.1%) . The DeMeester Score was elevated at 18.3 (>14.7) for all days combined. Total AET is 3.1% for all days combined which is physiological . There was no symptom correlation for symptoms of heartburn and patient symptoms with total of 15 occurrences reported ( SI:16 and SAP:80.4). This suggest that patient symptoms are not due to any pathological acid mediated GERD   Last EGD:03/09/24 - No endoscopic  esophageal abnormality to explain patient' s dysphagia. Esophagus dilated. Dilated. - Esophagogastric landmarks identified. - 1 cm hiatal hernia. - Gastroesophageal flap valve classified as Hill Grade II ( fold present, opens with respiration) . - Erythematous mucosa in the stomach. Biopsied. - Normal duodenal bulb and second portion of the duodenum. - Biopsies were taken with a cold forceps for evaluation of eosinophilic esophagitis. - The BRAVO pH capsule was deployed.  Last Colonoscopy:03/13/21 One 3 mm polyp in the descending colon-tubular adenoma - One 6 mm polyp in the sigmoid colon-tubular adenoma - External hemorrhoids. - Anal papilla(e) were hypertrophied   Past Medical History: Past Medical History:  Diagnosis Date   Anxiety    ASCUS of cervix with negative high risk HPV 04/23/2021   04/23/21 repeat pap in 3 years per [facility]   GERD (gastroesophageal reflux disease)    Hiatal hernia    Seasonal allergies     Past Surgical History: Past Surgical History:  Procedure Laterality Date   BIOPSY  03/13/2021   Procedure: BIOPSY;  Surgeon: Ruby Corporal, MD;  [facility];  Service: Endoscopy;;  descending colon polyp   BIOPSY  02/12/2022   Procedure: BIOPSY;  Surgeon: Ruby Corporal, MD;  [facility];  Service: Endoscopy;;   BRAVO PH STUDY N/A 03/09/2024   Procedure: PH MONITORING, ESOPHAGUS, WIRELESS;  Surgeon: Hargis Lias, MD;  [facility];  Service: Endoscopy;  Laterality: N/A;  9:00AM;ASA 1-2   CHOLECYSTECTOMY     COLONOSCOPY N/A 01/23/2016   Procedure: COLONOSCOPY;  Surgeon: Mathews Solomons  Homero Luster, MD;  [facility];  Service: Endoscopy;  Laterality: N/A;  830   COLONOSCOPY N/A 03/13/2021   Rehman: 3mm tubular adenoma in descending colon, 6mm tubular adenoma in sigmoid colon, external hemorrhoids, anal papillae hypertrophied   ESOPHAGOGASTRODUODENOSCOPY N/A 03/09/2024   Procedure: EGD (ESOPHAGOGASTRODUODENOSCOPY);  Surgeon: Hargis Lias, MD;  [facility];  Service: Endoscopy;  Laterality: N/A;  9:00AM;ASA 1-2, pt knows to arrive at 6:30   ESOPHAGOGASTRODUODENOSCOPY (EGD) WITH PROPOFOL  N/A 02/12/2022   Procedure: ESOPHAGOGASTRODUODENOSCOPY (EGD) WITH PROPOFOL ;  Surgeon: Ruby Corporal, MD;  [facility];  Service: Endoscopy;  Laterality: N/A;  115   LAPAROSCOPY     NASAL SEPTUM SURGERY     POLYPECTOMY  03/13/2021   Procedure: POLYPECTOMY;  Surgeon: Ruby Corporal, MD;  [facility];  Service: Endoscopy;;  sigmoid colon polyp    Family History: Family History  Problem Relation Age of Onset   Crohn's disease Mother    Cirrhosis Mother    Diabetes Father    Heart disease Father    COPD Father    Hypertension Father    Arthritis Sister    Heart disease Maternal Grandmother    Diabetes Maternal Grandmother    Arthritis Maternal Grandmother    Heart disease Paternal Grandfather    Diabetes Paternal Grandfather    Colon cancer Neg Hx     Social History: Social History   Tobacco Use  Smoking Status Never   Passive exposure: Never  Smokeless Tobacco Never   Social History   Substance and Sexual Activity  Alcohol  Use Yes   Comment: 1 glass of wine  a year   Social History   Substance and Sexual Activity  Drug Use No    Allergies: Allergies  Allergen Reactions   Amoxicillin Nausea Only    Stomach pain.   Latex Rash    Medications: Current Outpatient Medications  Medication Sig Dispense Refill   Calcium Carbonate-Vitamin D (CALCIUM 600+D PO) Take 1 tablet by mouth daily.     docusate sodium (COLACE) 100 MG capsule Take 100 mg by mouth at bedtime. 2 at bedtime     escitalopram (LEXAPRO) 10 MG tablet Take 5 mg by mouth at bedtime.     estradiol  (VIVELLE -DOT) 0.0375 MG/24HR Place 1 patch onto the skin 2 (two) times a week. 24 patch 3   fluconazole  (DIFLUCAN ) 150 MG tablet Take 1 now and 1 in 3 days if needed 2 tablet 2   fluticasone (FLONASE) 50 MCG/ACT  nasal spray Place 1 spray into both nostrils 2 (two) times daily as needed for allergies or rhinitis.     loratadine (CLARITIN) 10 MG tablet Take 10 mg by mouth daily.     Multiple Vitamins-Minerals (MULTIVITAMIN WITH MINERALS) tablet Take 1 tablet by mouth daily.     naratriptan (AMERGE) 2.5 MG tablet Take 2.5 mg by mouth as needed.     ondansetron  (ZOFRAN ) 4 MG tablet Take 1 tablet (4 mg total) by mouth every 8 (eight) hours as needed for nausea or vomiting. 30 tablet 1   polyethylene glycol (MIRALAX / GLYCOLAX) 17 g packet Take 17 g by mouth daily as needed for mild constipation.     progesterone  (PROMETRIUM ) 100 MG capsule TAKE 1 CAPSULE BY MOUTH EVERYDAY AT BEDTIME 90 capsule 4   Propylene Glycol (SYSTANE COMPLETE) 0.6 % SOLN Place 1 drop into both eyes in the morning and at bedtime.     triamcinolone cream (KENALOG) 0.1 %  Apply 1 application. topically 3 (three) times daily as needed for rash.     UNABLE TO FIND Hydrocortisone  rectal cream- prn     vitamin B-12 (CYANOCOBALAMIN) 1000 MCG tablet Take 1,000 mcg by mouth daily.     Vonoprazan Fumarate  (VOQUEZNA ) 10 MG TABS Take 10 mg by mouth daily. 90 tablet 1   No current facility-administered medications for this visit.    Review of Systems: GENERAL: negative for malaise, night sweats HEENT: No changes in hearing or vision, no nose bleeds or other nasal problems. NECK: Negative for lumps, goiter, pain and significant neck swelling RESPIRATORY: Negative for cough, wheezing CARDIOVASCULAR: Negative for chest pain, leg swelling, palpitations, orthopnea GI: SEE HPI MUSCULOSKELETAL: Negative for joint pain or swelling, back pain, and muscle pain. SKIN: Negative for lesions, rash PSYCH: Negative for sleep disturbance, mood disorder and recent psychosocial stressors. HEMATOLOGY Negative for prolonged bleeding, bruising easily, and swollen nodes. ENDOCRINE: Negative for cold or heat intolerance, polyuria, polydipsia and goiter. NEURO:  negative for tremor, gait imbalance, syncope and seizures. The remainder of the review of systems is noncontributory.   Physical Exam: BP 119/61 (BP Location: Left Arm, Patient Position: Sitting, Cuff Size: Normal)   Pulse 69   Temp 97.8 F (36.6 C) (Temporal)   Ht 5\' 3"  (1.6 m)   Wt 155 lb 8 oz (70.5 kg)   BMI 27.55 kg/m  GENERAL: The patient is AO x3, in no acute distress. HEENT: Head is normocephalic and atraumatic. EOMI are intact. Mouth is well hydrated and without lesions. NECK: Supple. No masses LUNGS: Clear to auscultation. No presence of rhonchi/wheezing/rales. Adequate chest expansion HEART: RRR, normal s1 and s2. ABDOMEN: Soft, nontender, no guarding, no peritoneal signs, and nondistended. BS +. No masses. EXTREMITIES: Without any cyanosis, clubbing, rash, lesions or edema. NEUROLOGIC: AOx3, no focal motor deficit. SKIN: no jaundice, no rashes   Imaging/Labs: as above  I personally reviewed and interpreted the available labs, imaging and endoscopic files.  Impression and Plan: Toni Wolf is a 58 y.o. female with past medical history of GERD and anxiety, who presents for follow up of GERD and throat clearing.  Patient had evidence of GERD in the past for which she has been treated with different PPIs and most recently with PCAB.  Unfortunately, she continued to endorse throat clearing and throat discomfort.  Due to this we proceeded with pH Bravo testing that only showed increased amount of acid in one of the 4 days where the pH was measured in the esophagus.  This raises the concern for possible hypersensitivity to reflux with a functional component as she was also endorsing symptoms on the days there was no pathologic acid exposure.  Notably, the patient has presented significant improvement after her last endoscopy while on Voquezna .  Due to this, we will continue the medication at the same dosage and we will hold off on any further changes for her medication.  If  patient presents recurrent symptoms of globus sensation and esophageal hypersensitivity, can consider low-dose TCA.  Finally, her bowel habits have improved with the use of MiraLAX which she will need to continue on a regular basis.  -Continue MiraLAX on a daily basis, can cut down to half a capful daily -Continue Voquezna  10 mg daily  All questions were answered.      Toni Cress, MD Gastroenterology and Hepatology [facility]

## 2024-04-17 NOTE — Patient Instructions (Signed)
 Continue MiraLAX on a daily basis, can cut down to half a capful daily Continue Voquezna  10 mg daily

## 2024-05-05 ENCOUNTER — Ambulatory Visit (HOSPITAL_COMMUNITY)
Admission: RE | Admit: 2024-05-05 | Discharge: 2024-05-05 | Disposition: A | Discharge status: Home / Self Care | Source: Ambulatory Visit | Attending: Adult Health | Admitting: Adult Health

## 2024-05-05 DIAGNOSIS — Z1231 Encounter for screening mammogram for malignant neoplasm of breast: Secondary | ICD-10-CM | POA: Diagnosis present

## 2024-05-09 ENCOUNTER — Ambulatory Visit: Payer: Self-pay | Admitting: Adult Health

## 2024-06-21 ENCOUNTER — Other Ambulatory Visit: Payer: Self-pay | Admitting: Adult Health

## 2024-06-21 MED ORDER — FLUCONAZOLE 150 MG PO TABS: ORAL_TABLET | ORAL | 2 refills | Status: DC

## 2024-06-21 NOTE — Progress Notes (Signed)
 Refilled diflucan

## 2024-08-15 ENCOUNTER — Other Ambulatory Visit: Payer: Self-pay | Admitting: Adult Health

## 2024-08-23 ENCOUNTER — Encounter (INDEPENDENT_AMBULATORY_CARE_PROVIDER_SITE_OTHER): Payer: Self-pay | Admitting: Gastroenterology

## 2024-09-21 ENCOUNTER — Encounter (INDEPENDENT_AMBULATORY_CARE_PROVIDER_SITE_OTHER): Payer: Self-pay | Admitting: Gastroenterology

## 2024-09-21 ENCOUNTER — Other Ambulatory Visit (INDEPENDENT_AMBULATORY_CARE_PROVIDER_SITE_OTHER): Payer: Self-pay

## 2024-09-21 DIAGNOSIS — K2970 Gastritis, unspecified, without bleeding: Secondary | ICD-10-CM

## 2024-09-21 DIAGNOSIS — K219 Gastro-esophageal reflux disease without esophagitis: Secondary | ICD-10-CM

## 2024-09-21 MED ORDER — VOQUEZNA 10 MG PO TABS: 10.0000 mg | ORAL_TABLET | Freq: Every day | ORAL | 1 refills | Status: AC

## 2024-10-03 ENCOUNTER — Other Ambulatory Visit: Payer: Self-pay | Admitting: Adult Health

## 2024-10-03 MED ORDER — FLUCONAZOLE 150 MG PO TABS: ORAL_TABLET | ORAL | 2 refills | Status: DC

## 2024-10-03 NOTE — Progress Notes (Signed)
 Rx diflucan.

## 2024-11-13 ENCOUNTER — Other Ambulatory Visit: Payer: Self-pay | Admitting: Adult Health

## 2024-11-13 MED ORDER — ESTRADIOL 0.0375 MG/24HR TD PTTW: 1.0000 | MEDICATED_PATCH | TRANSDERMAL | 3 refills | Status: AC

## 2024-11-13 NOTE — Progress Notes (Unsigned)
Refilled vivelle-dot

## 2024-11-16 ENCOUNTER — Encounter: Payer: Self-pay | Admitting: Adult Health

## 2024-11-16 ENCOUNTER — Ambulatory Visit: Admitting: Adult Health

## 2024-11-16 VITALS — BP 116/81 | HR 73 | Ht 63.0 in | Wt 161.0 lb

## 2024-11-16 DIAGNOSIS — N898 Other specified noninflammatory disorders of vagina: Secondary | ICD-10-CM | POA: Diagnosis not present

## 2024-11-16 MED ORDER — FLUCONAZOLE 150 MG PO TABS: ORAL_TABLET | ORAL | 2 refills | Status: AC

## 2024-11-16 NOTE — Progress Notes (Signed)
" °  Subjective:     Patient ID: Toni Wolf, female   DOB: [birth year], 59 y.o.   MRN: [id number]  HPI Toni Wolf is a 59 year old white female, married, PM, in complaining of vaginal irritation, feels wet. Diflucan  does help She is on HRT.     Component Value Date/Time   DIAGPAP  02/08/2024 1545    - Negative for intraepithelial lesion or malignancy (NILM)   DIAGPAP (A) 04/17/2021 1442    - Atypical squamous cells of undetermined significance (ASC-US )   HPVHIGH Negative 02/08/2024 1545   HPVHIGH Negative 04/17/2021 1442   ADEQPAP  02/08/2024 1545    Satisfactory for evaluation; transformation zone component ABSENT.   ADEQPAP  04/17/2021 1442    Satisfactory for evaluation; transformation zone component PRESENT.    PCP is Dr Toribio. Review of Systems +vaginal irritation, feels wet Reviewed past medical,surgical, social and family history. Reviewed medications and allergies.     Objective:   Physical Exam BP 116/81 (BP Location: Right Arm, Patient Position: Sitting, Cuff Size: Normal)   Pulse 73   Ht 5' 3 (1.6 m)   Wt 161 lb (73 kg)   BMI 28.52 kg/m     Skin warm and dry.Pelvic: external genitalia is normal in appearance, has a few angiokeratoma on vulva, vagina: pale, has scant white discharge, no odor,urethra has no lesions or masses noted, cervix:smooth, uterus: normal size, shape and contour, non tender, no masses felt, adnexa: no masses or tenderness noted. Bladder is non tender and no masses felt. Fall risk is low  Upstream - 11/16/24 1038       Pregnancy Intention Screening   Does the patient want to become pregnant in the next year? N/A    Does the patient's partner want to become pregnant in the next year? N/A    Would the patient like to discuss contraceptive options today? N/A      Contraception Wrap Up   Current Method Post-Menopause    End Method Post-Menopause    Contraception Counseling Provided No         Examination chaperoned by Clarita Salt  LPN  Assessment:     1. Vaginal irritation (Primary) Feels irritated and feels wet, has scant discharge, no odor Will rx diflucan   Meds ordered this encounter  Medications   fluconazole  (DIFLUCAN ) 150 MG tablet    Sig: Take 1 now and 1 in 3 days if needed    Dispense:  2 tablet    Refill:  2    Supervising Provider:   JAYNE VONN DEL [[id number]]       Plan:     Follow up prn     "
